# Patient Record
Sex: Female | Born: 1970
Health system: Southern US, Community
[De-identification: ages and names within clinical notes are randomized; demographics above are authoritative.]

## PROBLEM LIST (undated history)

## (undated) DIAGNOSIS — K219 Gastro-esophageal reflux disease without esophagitis: Secondary | ICD-10-CM

## (undated) HISTORY — PX: BLADDER SURGERY: SHX569

## (undated) HISTORY — DX: Gastro-esophageal reflux disease without esophagitis: K21.9

---

## 2001-04-07 ENCOUNTER — Encounter: Admission: RE | Admit: 2001-04-07 | Discharge: 2001-04-07 | Payer: Self-pay | Admitting: General Practice

## 2001-04-07 ENCOUNTER — Encounter: Payer: Self-pay | Admitting: General Practice

## 2001-04-16 ENCOUNTER — Encounter: Admission: RE | Admit: 2001-04-16 | Discharge: 2001-04-16 | Payer: Self-pay | Admitting: Urology

## 2001-04-16 ENCOUNTER — Encounter: Payer: Self-pay | Admitting: Urology

## 2002-02-28 ENCOUNTER — Encounter: Admission: RE | Admit: 2002-02-28 | Discharge: 2002-02-28 | Payer: Self-pay | Admitting: Internal Medicine

## 2002-03-08 ENCOUNTER — Other Ambulatory Visit: Admission: RE | Admit: 2002-03-08 | Discharge: 2002-03-08 | Payer: Self-pay | Admitting: Obstetrics and Gynecology

## 2003-12-05 ENCOUNTER — Inpatient Hospital Stay (HOSPITAL_COMMUNITY): Admission: AD | Admit: 2003-12-05 | Discharge: 2003-12-05 | Payer: Self-pay | Admitting: *Deleted

## 2004-03-11 ENCOUNTER — Emergency Department (HOSPITAL_COMMUNITY): Admission: EM | Admit: 2004-03-11 | Discharge: 2004-03-11 | Payer: Self-pay | Admitting: Family Medicine

## 2004-03-21 ENCOUNTER — Ambulatory Visit: Payer: Self-pay | Admitting: Sports Medicine

## 2004-04-04 ENCOUNTER — Ambulatory Visit (HOSPITAL_COMMUNITY): Admission: RE | Admit: 2004-04-04 | Discharge: 2004-04-04 | Payer: Self-pay | Admitting: *Deleted

## 2004-04-16 ENCOUNTER — Ambulatory Visit: Payer: Self-pay | Admitting: Family Medicine

## 2004-04-23 ENCOUNTER — Ambulatory Visit: Payer: Self-pay | Admitting: Family Medicine

## 2004-05-14 ENCOUNTER — Ambulatory Visit: Payer: Self-pay | Admitting: Family Medicine

## 2004-05-27 ENCOUNTER — Ambulatory Visit (HOSPITAL_COMMUNITY): Admission: RE | Admit: 2004-05-27 | Discharge: 2004-05-27 | Payer: Self-pay | Admitting: Family Medicine

## 2004-06-03 ENCOUNTER — Ambulatory Visit: Payer: Self-pay | Admitting: Family Medicine

## 2004-06-21 ENCOUNTER — Ambulatory Visit: Payer: Self-pay | Admitting: Family Medicine

## 2004-07-04 ENCOUNTER — Ambulatory Visit: Payer: Self-pay | Admitting: Family Medicine

## 2004-07-09 ENCOUNTER — Ambulatory Visit: Payer: Self-pay | Admitting: Family Medicine

## 2004-07-10 ENCOUNTER — Ambulatory Visit (HOSPITAL_COMMUNITY): Admission: RE | Admit: 2004-07-10 | Discharge: 2004-07-10 | Payer: Self-pay | Admitting: Family Medicine

## 2004-07-18 ENCOUNTER — Ambulatory Visit: Payer: Self-pay | Admitting: Family Medicine

## 2004-07-24 ENCOUNTER — Ambulatory Visit: Payer: Self-pay | Admitting: Family Medicine

## 2004-07-26 ENCOUNTER — Ambulatory Visit (HOSPITAL_COMMUNITY): Admission: RE | Admit: 2004-07-26 | Discharge: 2004-07-26 | Payer: Self-pay | Admitting: Family Medicine

## 2004-07-28 ENCOUNTER — Inpatient Hospital Stay (HOSPITAL_COMMUNITY): Admission: AD | Admit: 2004-07-28 | Discharge: 2004-07-30 | Payer: Self-pay | Admitting: Obstetrics & Gynecology

## 2004-07-28 ENCOUNTER — Encounter (INDEPENDENT_AMBULATORY_CARE_PROVIDER_SITE_OTHER): Payer: Self-pay | Admitting: *Deleted

## 2004-07-28 ENCOUNTER — Ambulatory Visit: Payer: Self-pay | Admitting: Sports Medicine

## 2004-07-28 ENCOUNTER — Ambulatory Visit: Payer: Self-pay | Admitting: Obstetrics & Gynecology

## 2004-09-11 ENCOUNTER — Ambulatory Visit: Payer: Self-pay | Admitting: Family Medicine

## 2004-10-16 ENCOUNTER — Ambulatory Visit: Payer: Self-pay | Admitting: Family Medicine

## 2005-01-15 ENCOUNTER — Ambulatory Visit: Payer: Self-pay | Admitting: Family Medicine

## 2005-02-14 ENCOUNTER — Ambulatory Visit: Payer: Self-pay | Admitting: Family Medicine

## 2005-04-14 ENCOUNTER — Ambulatory Visit: Payer: Self-pay | Admitting: Family Medicine

## 2005-04-28 ENCOUNTER — Encounter (INDEPENDENT_AMBULATORY_CARE_PROVIDER_SITE_OTHER): Payer: Self-pay | Admitting: *Deleted

## 2005-04-28 LAB — CONVERTED CEMR LAB

## 2005-05-19 ENCOUNTER — Ambulatory Visit: Payer: Self-pay | Admitting: Family Medicine

## 2005-05-26 ENCOUNTER — Ambulatory Visit: Payer: Self-pay | Admitting: Family Medicine

## 2005-06-09 IMAGING — US US OB DETAIL+14 WK
1 series · 18 of 28 positions shown · non-contrast
Comparison: none

CLINICAL DATA: G6 P3 TAB2 (twins).  One full term demise.  LMP 10/19/03.  Assess anatomy.

[Series 1: us ob detail +14 wk · 18 of 75 slices shown]
[im 1/75]
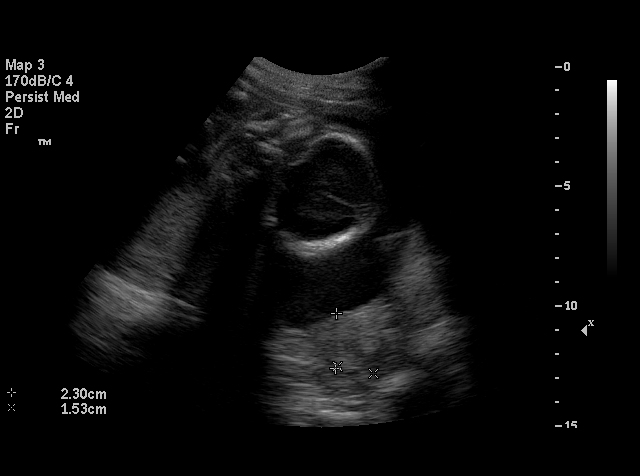
[im 6/75]
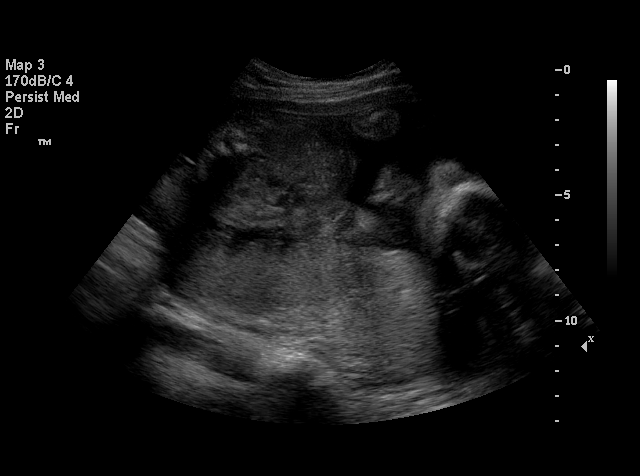
[im 9/75]
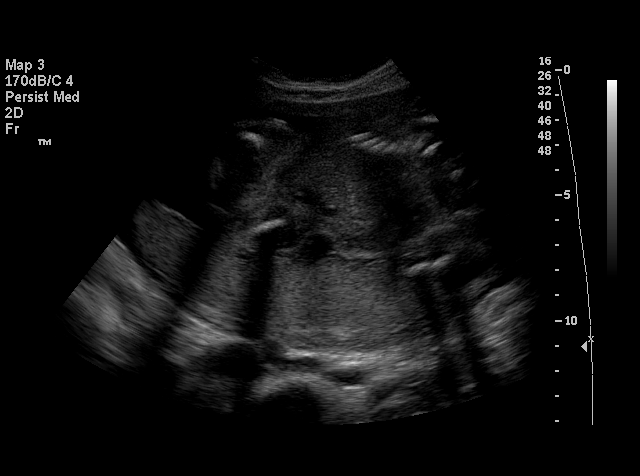
[im 14/75]
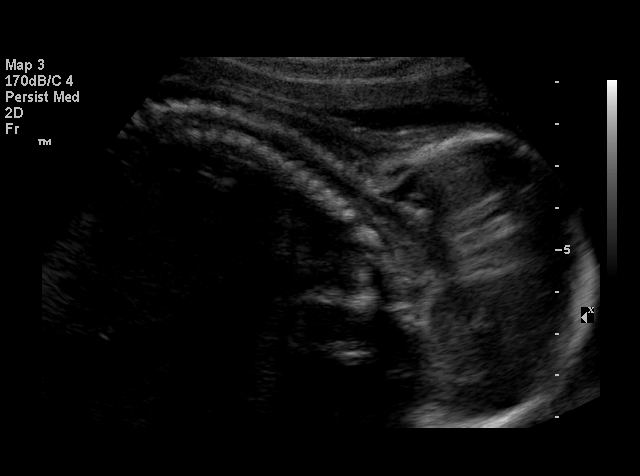
[im 20/75]
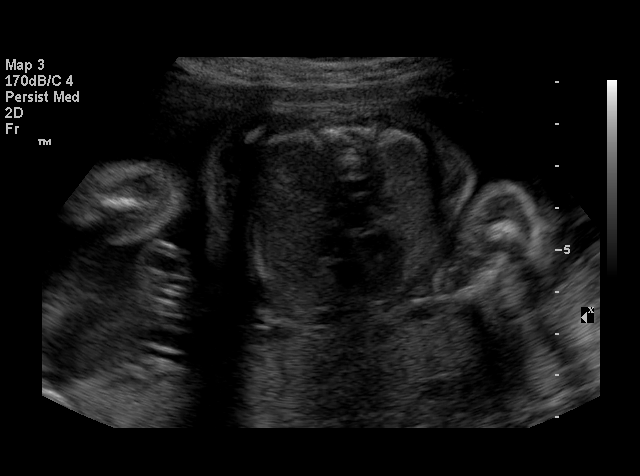
[im 22/75]
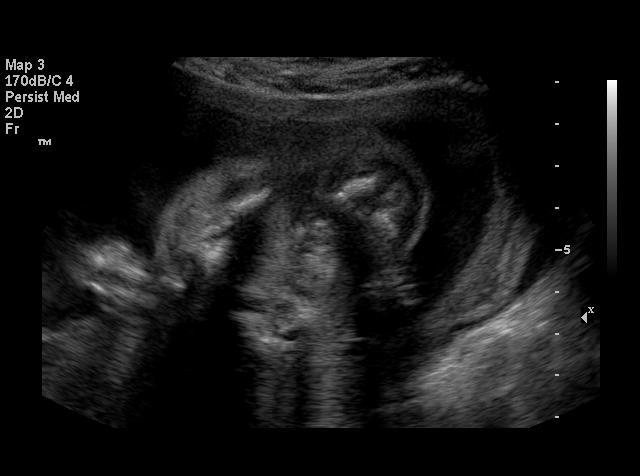
[im 28/75]
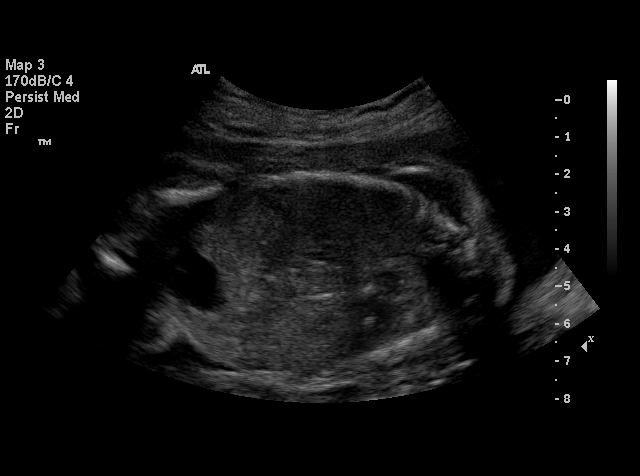
[im 31/75]
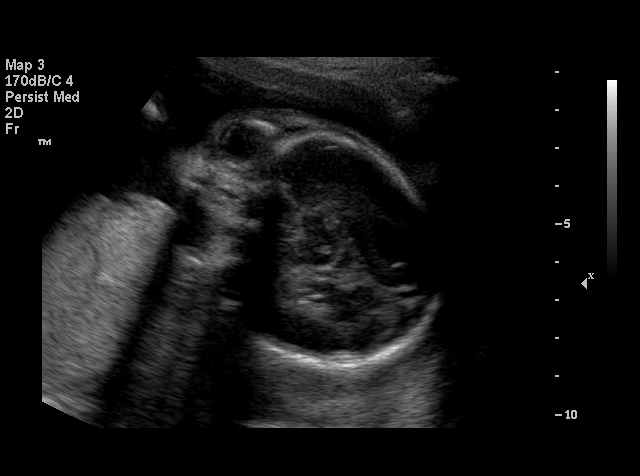
[im 36/75]
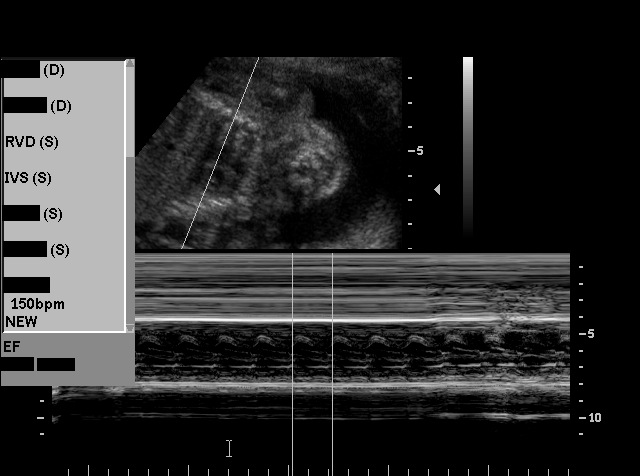
[im 39/75]
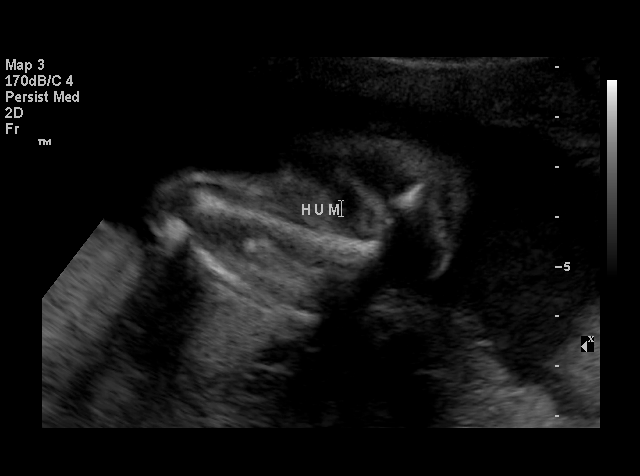
[im 44/75]
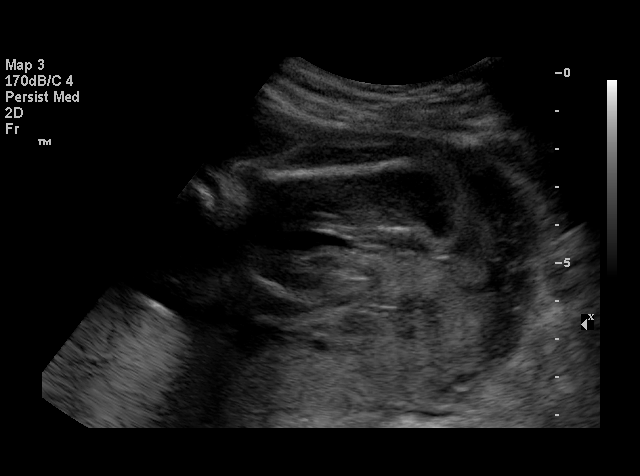
[im 47/75]
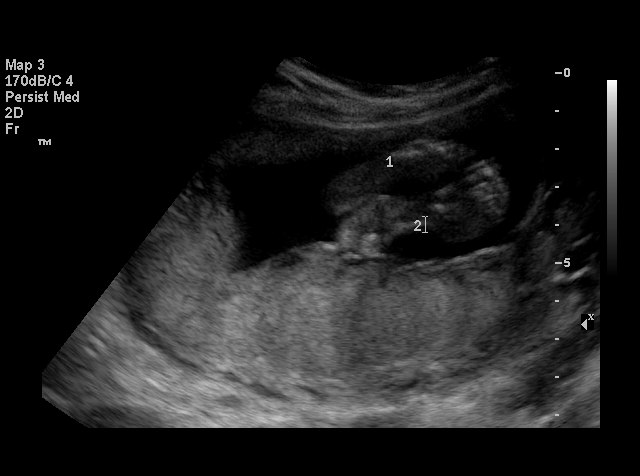
[im 53/75]
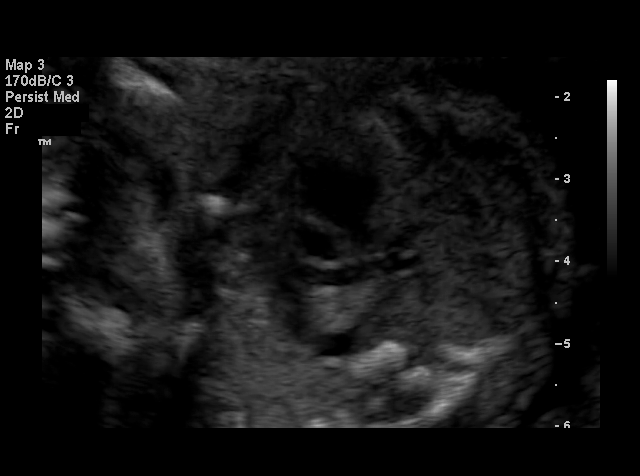
[im 58/75]
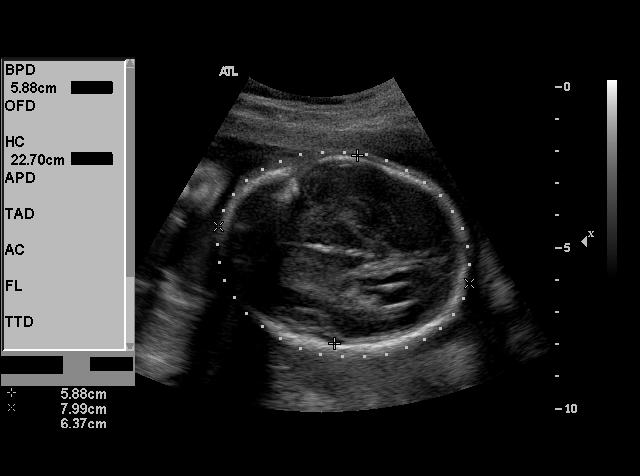
[im 61/75]
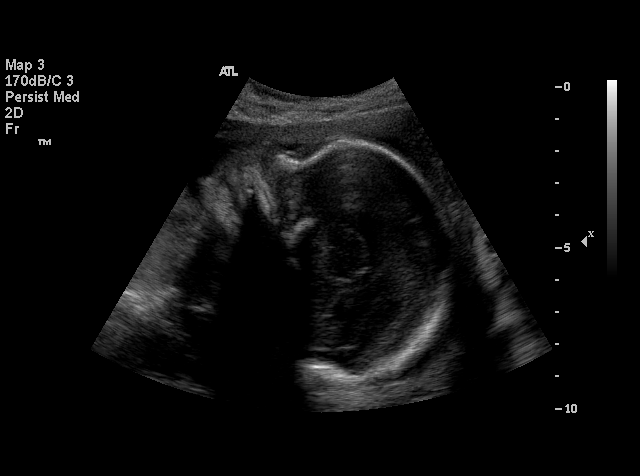
[im 66/75]
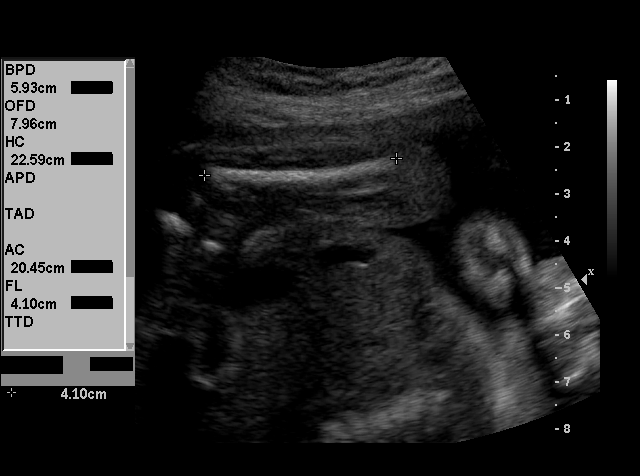
[im 69/75]
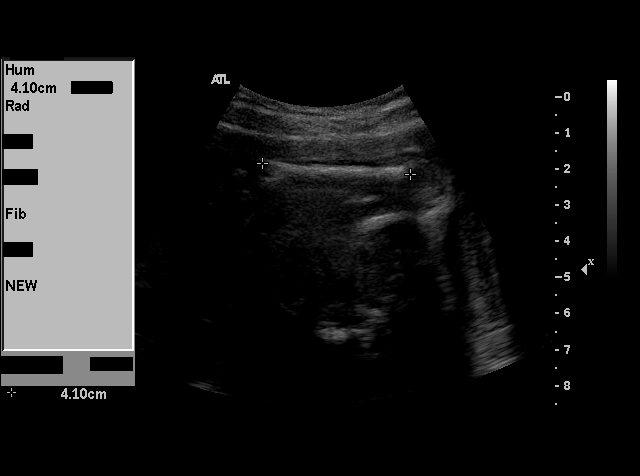
[im 75/75]
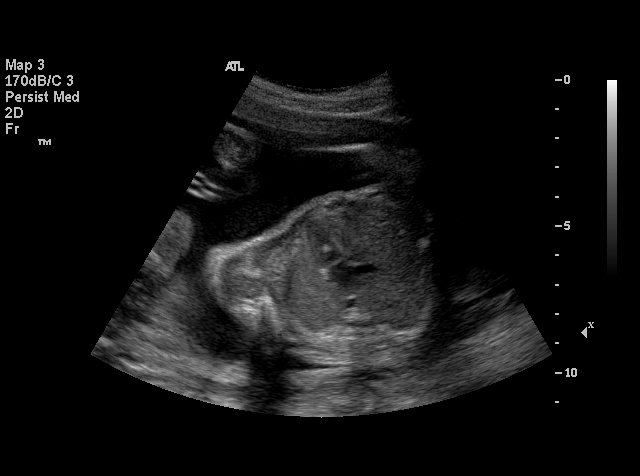

[18 of 28 positions shown; findings below may reference images not displayed]

DETAILED OBSTETRICAL ULTRASOUND 

 Number of Fetuses:  1
 Heart Rate:  150
 Movement:  Yes
 Breathing:  Yes
 Presentation:  Cephalic
 Placental Location:  Posterior
 Grade:  I
 Previa:  No
 Amniotic Fluid (Subjective):  Normal
 Amniotic Fluid (Objective):  3.9 cm Vertical pocket 

 FETAL BIOMETRY
 BPD:  5.9 cm   24 w 0 d
 HC:  22.4 cm  24 w 4 d
 AC:  20.5 cm   25 w 1 d
 FL:   4.2 cm   23 w 4 d

 MEAN GA:  24 w 3 d
 GA BY LMP:  24 w 0 d (Assigned)

 FETAL ANATOMY
 Lateral Ventricles:  Visualized 
 Thalami/CSP:  Visualized 
 Posterior Fossa:  Visualized 
 Nuchal Region:  N/A
 Spine:  Visualized 
 4 Chamber Heart on Left:  Visualized 
 Stomach on Left:  Visualized 
 3 Vessel Cord:  Visualized 
 Cord Insertion Site:  Visualized 
 Kidneys:  Visualized 
 Bladder:  Visualized 
 Extremities:  Visualized 

 ADDITIONAL ANATOMY VISUALIZED:  LVOT, RVOT, upper lip, orbits, profile, diaphragm, heel, 5th digit, ductal arch, aortic arch, and female genitalia.  
 Comment:  There is an EIF in the left ventricle.  This finding is quite common in the Asian population and is not felt to be a marker for Down syndrome in this population.

 MATERNAL FINDINGS
 Cervix:  3.8 cm Transabdominally
IMPRESSION: Single living intrauterine fetus in cephalic presentation.  Patient is 24 weeks by LMP dating and measures 24 weeks 3 days today.  Growth is appropriate 
 No focal fetal or placental abnormality identified.  

 </u12:p>

## 2005-07-14 ENCOUNTER — Ambulatory Visit: Payer: Self-pay | Admitting: Sports Medicine

## 2005-07-23 ENCOUNTER — Emergency Department (HOSPITAL_COMMUNITY): Admission: EM | Admit: 2005-07-23 | Discharge: 2005-07-23 | Payer: Self-pay | Admitting: Emergency Medicine

## 2005-08-01 IMAGING — US US OB FOLLOW-UP
1 series · 13 of 28 positions shown · non-contrast
Comparison: none

CLINICAL DATA: Assess amniotic fluid volume and fetal growth.

[Series 1: us ob follow-up · 0.32mm/px · 13 of 39 slices shown]
[im 2/39]
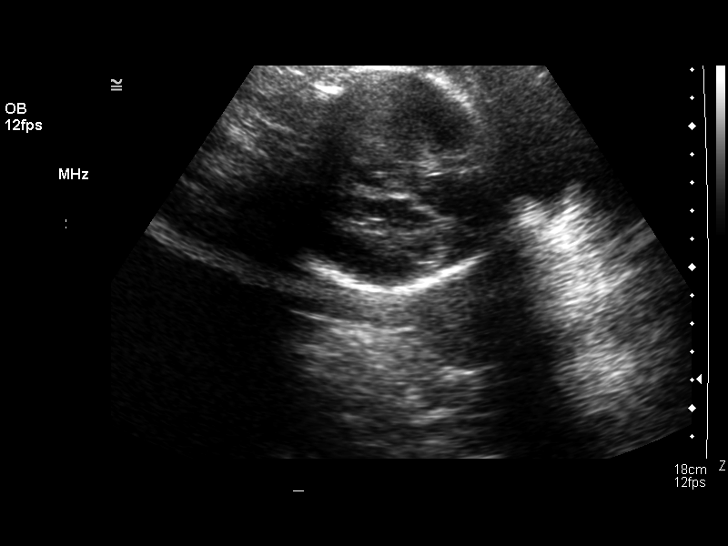
[im 5/39]
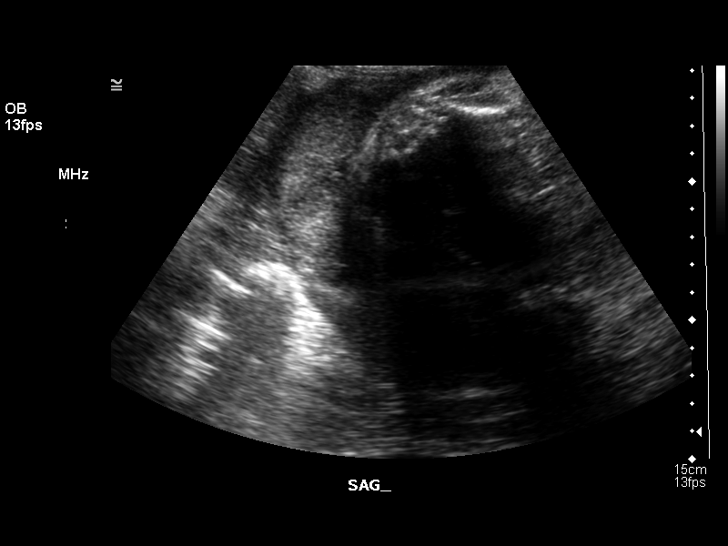
[im 8/39]
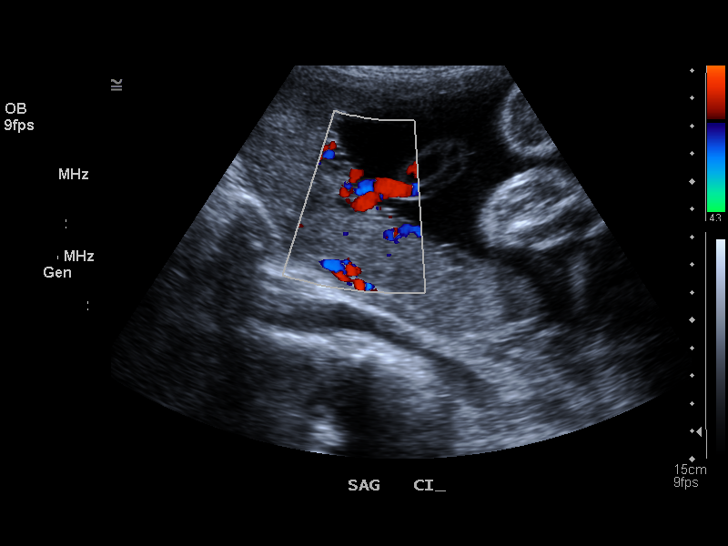
[im 10/39]
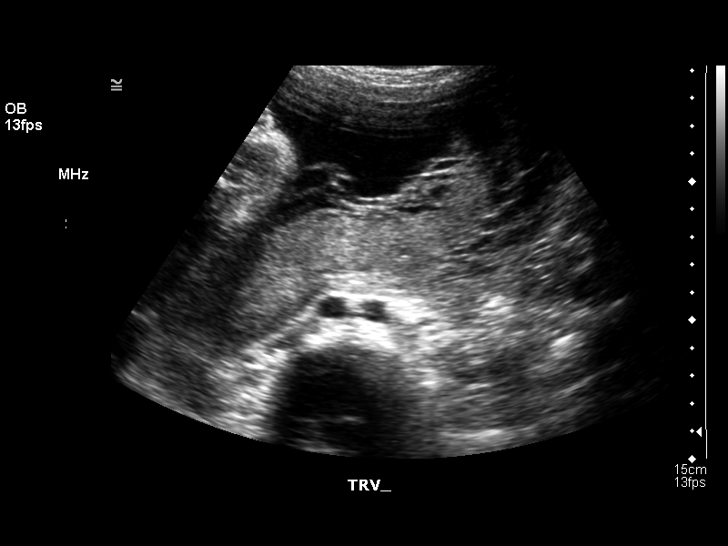
[im 13/39]
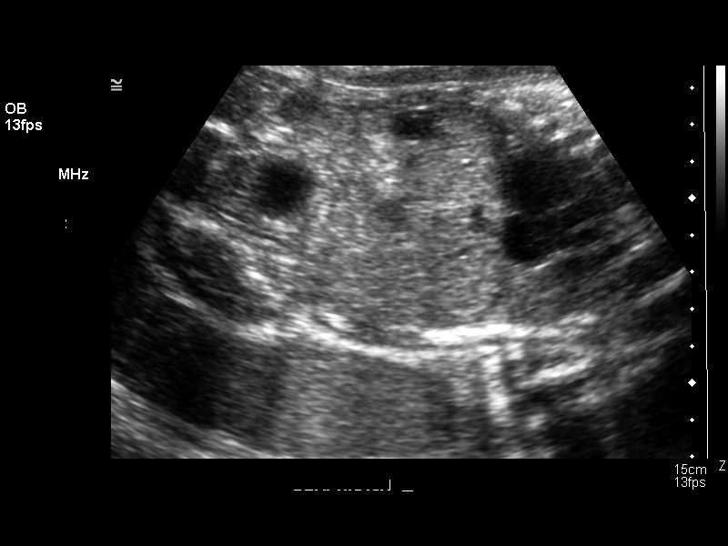
[im 16/39]
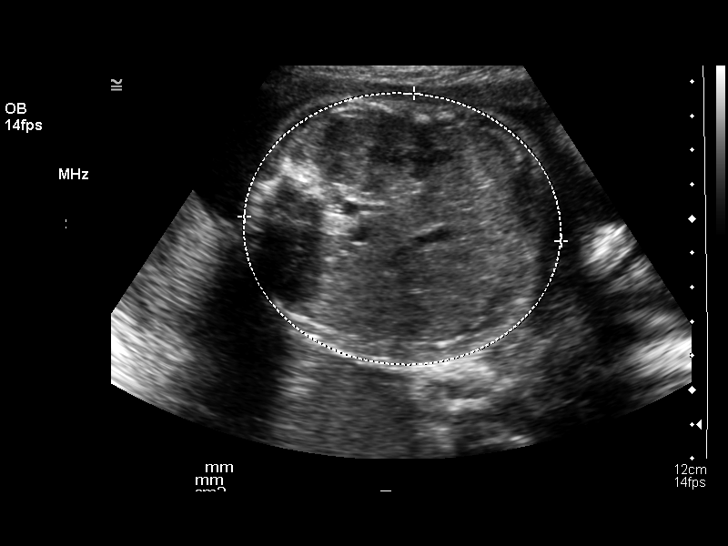
[im 20/39]
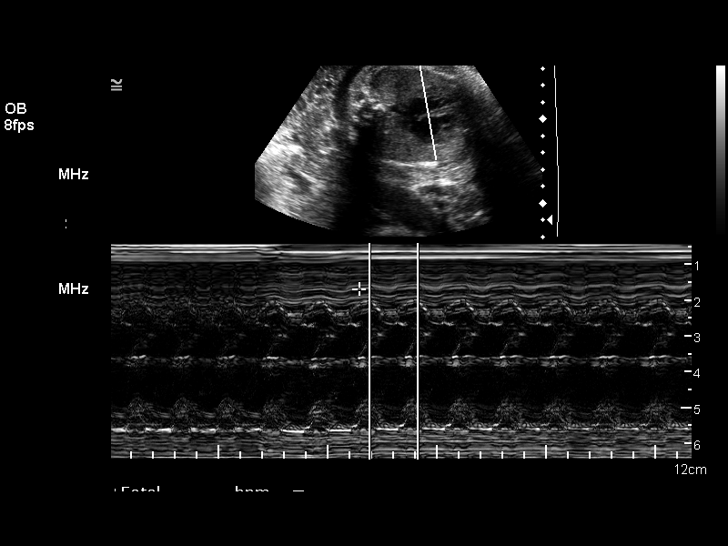
[im 23/39]
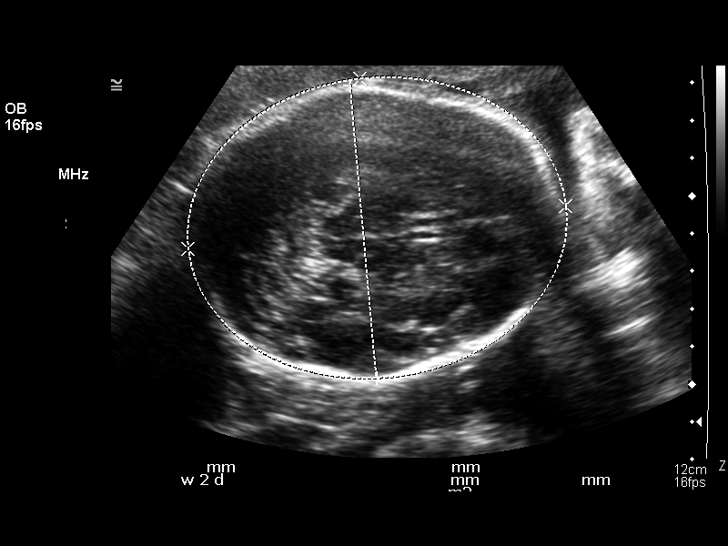
[im 26/39]
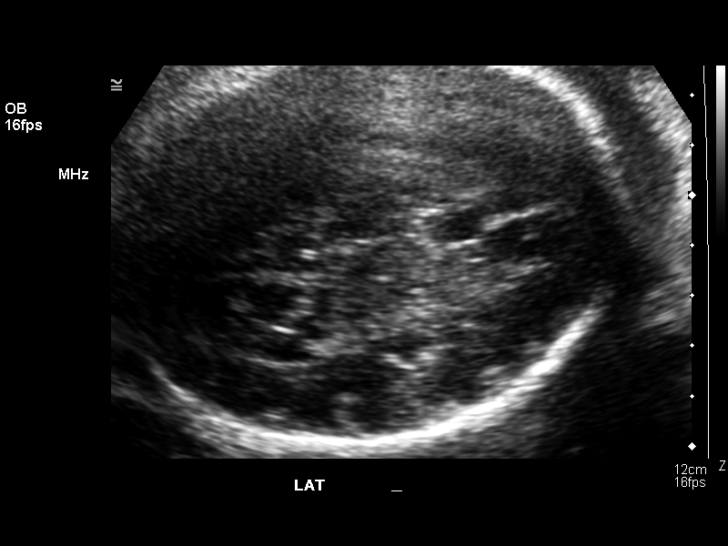
[im 29/39]
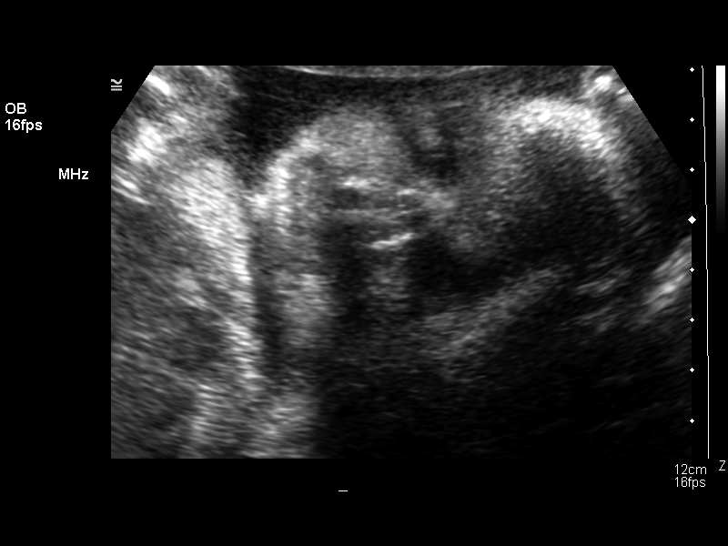
[im 31/39]
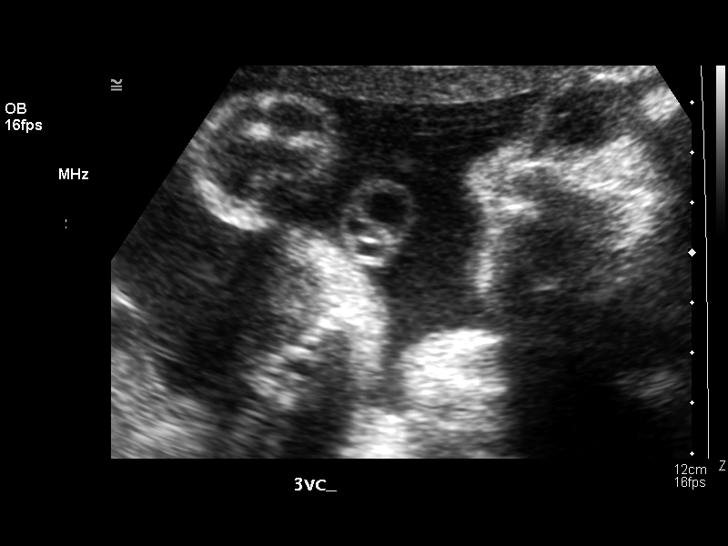
[im 34/39]
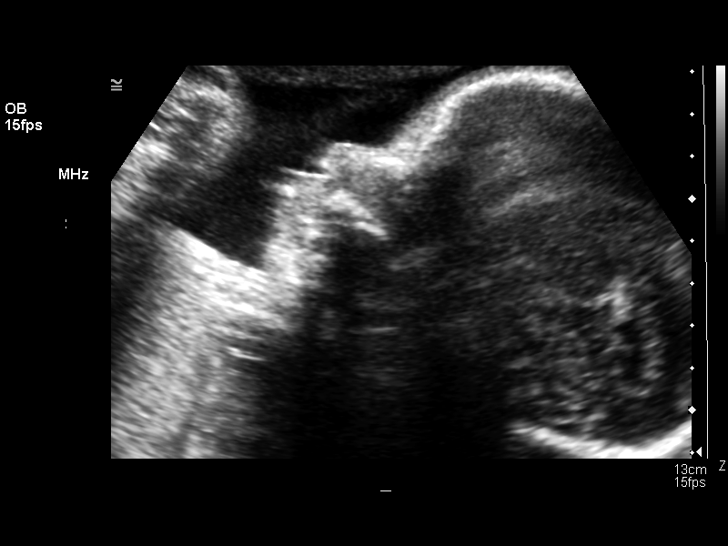
[im 37/39]
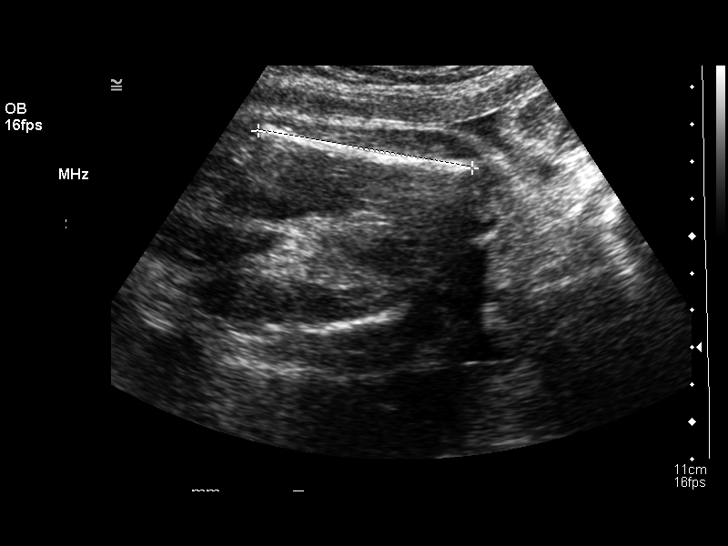

[13 of 28 positions shown; findings below may reference images not displayed]

OBSTETRICAL ULTRASOUND RE-EVALUATION:
Number of Fetuses:  1
Heart Rate:  136
Movement:  Yes
Breathing:  No
Presentation:  Cephalic
Placental Location:  Posterior
Grade:  I
Previa:  No
Amniotic Fluid (subjective):  Normal
Amniotic Fluid (objective): 13.9 cm AFI (5th -95th%ile = 8.6 ? 24.2 cm for 32 wks)

FETAL BIOMETRY
BPD:  7.8 cm   31 w 2 d
HC:  28.5 cm  31 w 2 d
AC:  27.3 cm   31 w 3 d
FL:  5.9 cm  30 w 5 d

Mean GA:  31 w 1 d
Assigned GA:  31 w 4 d
Fetal indices are within normal limits.

EFW:  2628 g (H) 50th ? 75th%ile (0638 ? 1788 g) For 32 wks

FETAL ANATOMY
Lateral Ventricles:  Visualized 
Thalami/CSP:  Visualized 
Posterior Fossa:  Visualized 
Nuchal Region:  N/A
Spine:  Previously seen 
4 Chamber Heart on Left:  Previously seen 
Stomach on Left:  Visualized 
3 Vessel Cord:  Visualized 
Cord Insertion Site:  Previously seen 
Kidneys:  Visualized 
Bladder:  Visualized 
Extremities:  Previously seen 

MATERNAL UTERINE AND ADNEXAL FINDINGS
Cervix:  3.1 cm Transabdominally
IMPRESSION: 1.  Single intrauterine pregnancy demonstrating an estimated gestational age by ultrasound of 31 weeks and 1 day.  Correlation with assigned gestational age by LMP and initial ultrasound of 31 weeks and 4 days suggests appropriate growth.  Currently the estimated fetal weight is just above the 50th percentile for a 32 week gestation. 
2.  Subjectively and quantitatively normal amniotic fluid volume.
3.  No late developing fetal anatomic abnormalities are identified associated with the lateral ventricles, stomach, kidneys or bladder.  A four chamber heart view could not be reassessed due to positioning on today?s exam.

## 2005-10-13 ENCOUNTER — Ambulatory Visit: Payer: Self-pay | Admitting: Family Medicine

## 2005-12-17 ENCOUNTER — Ambulatory Visit: Payer: Self-pay | Admitting: Family Medicine

## 2006-02-05 ENCOUNTER — Ambulatory Visit: Payer: Self-pay | Admitting: Family Medicine

## 2006-08-19 ENCOUNTER — Encounter: Payer: Self-pay | Admitting: Family Medicine

## 2006-08-19 ENCOUNTER — Ambulatory Visit: Payer: Self-pay | Admitting: Family Medicine

## 2006-08-20 DIAGNOSIS — G5600 Carpal tunnel syndrome, unspecified upper limb: Secondary | ICD-10-CM | POA: Insufficient documentation

## 2006-08-21 ENCOUNTER — Encounter (INDEPENDENT_AMBULATORY_CARE_PROVIDER_SITE_OTHER): Payer: Self-pay | Admitting: *Deleted

## 2006-12-14 ENCOUNTER — Telehealth (INDEPENDENT_AMBULATORY_CARE_PROVIDER_SITE_OTHER): Payer: Self-pay | Admitting: *Deleted

## 2006-12-14 ENCOUNTER — Ambulatory Visit: Payer: Self-pay | Admitting: Sports Medicine

## 2007-03-29 ENCOUNTER — Ambulatory Visit: Payer: Self-pay | Admitting: Sports Medicine

## 2007-10-23 ENCOUNTER — Emergency Department (HOSPITAL_COMMUNITY): Admission: EM | Admit: 2007-10-23 | Discharge: 2007-10-23 | Payer: Self-pay | Admitting: Emergency Medicine

## 2007-10-28 ENCOUNTER — Telehealth: Payer: Self-pay | Admitting: *Deleted

## 2007-10-29 ENCOUNTER — Ambulatory Visit: Payer: Self-pay | Admitting: Family Medicine

## 2007-10-29 DIAGNOSIS — J309 Allergic rhinitis, unspecified: Secondary | ICD-10-CM | POA: Insufficient documentation

## 2007-11-26 ENCOUNTER — Encounter (INDEPENDENT_AMBULATORY_CARE_PROVIDER_SITE_OTHER): Payer: Self-pay | Admitting: Family Medicine

## 2007-11-26 ENCOUNTER — Ambulatory Visit: Payer: Self-pay | Admitting: Family Medicine

## 2007-11-26 ENCOUNTER — Telehealth: Payer: Self-pay | Admitting: *Deleted

## 2007-11-26 DIAGNOSIS — R1084 Generalized abdominal pain: Secondary | ICD-10-CM | POA: Insufficient documentation

## 2007-11-26 LAB — CONVERTED CEMR LAB
Albumin: 4.5 g/dL (ref 3.5–5.2)
Alkaline Phosphatase: 77 units/L (ref 39–117)
BUN: 13 mg/dL (ref 6–23)
Beta hcg, urine, semiquantitative: NEGATIVE
CO2: 22 meq/L (ref 19–32)
Calcium: 9.3 mg/dL (ref 8.4–10.5)
Chlamydia, DNA Probe: NEGATIVE
Creatinine, Ser: 0.87 mg/dL (ref 0.40–1.20)
Eosinophils Relative: 3 % (ref 0–5)
Glucose, Bld: 72 mg/dL (ref 70–99)
Lymphs Abs: 1.9 10*3/uL (ref 0.7–4.0)
Monocytes Absolute: 0.3 10*3/uL (ref 0.1–1.0)
Monocytes Relative: 8 % (ref 3–12)
Neutro Abs: 2 10*3/uL (ref 1.7–7.7)
Platelets: 152 10*3/uL (ref 150–400)
RBC: 5.53 M/uL — ABNORMAL HIGH (ref 3.87–5.11)
Total Bilirubin: 0.6 mg/dL (ref 0.3–1.2)
Total Protein: 8 g/dL (ref 6.0–8.3)
WBC: 4.4 10*3/uL (ref 4.0–10.5)

## 2007-11-29 ENCOUNTER — Telehealth: Payer: Self-pay | Admitting: *Deleted

## 2007-11-29 ENCOUNTER — Ambulatory Visit: Payer: Self-pay | Admitting: Family Medicine

## 2007-11-30 ENCOUNTER — Encounter (INDEPENDENT_AMBULATORY_CARE_PROVIDER_SITE_OTHER): Payer: Self-pay | Admitting: Family Medicine

## 2008-03-14 ENCOUNTER — Emergency Department (HOSPITAL_COMMUNITY): Admission: EM | Admit: 2008-03-14 | Discharge: 2008-03-14 | Payer: Self-pay | Admitting: Family Medicine

## 2008-03-16 ENCOUNTER — Encounter: Payer: Self-pay | Admitting: *Deleted

## 2008-04-17 ENCOUNTER — Encounter: Payer: Self-pay | Admitting: *Deleted

## 2008-07-31 ENCOUNTER — Ambulatory Visit: Payer: Self-pay | Admitting: Family Medicine

## 2008-09-06 ENCOUNTER — Telehealth: Payer: Self-pay | Admitting: Family Medicine

## 2008-09-07 ENCOUNTER — Encounter (INDEPENDENT_AMBULATORY_CARE_PROVIDER_SITE_OTHER): Payer: Self-pay | Admitting: Family Medicine

## 2008-09-07 ENCOUNTER — Ambulatory Visit: Payer: Self-pay | Admitting: Family Medicine

## 2008-09-07 DIAGNOSIS — R42 Dizziness and giddiness: Secondary | ICD-10-CM

## 2008-09-07 DIAGNOSIS — K219 Gastro-esophageal reflux disease without esophagitis: Secondary | ICD-10-CM

## 2008-09-07 DIAGNOSIS — R1013 Epigastric pain: Secondary | ICD-10-CM | POA: Insufficient documentation

## 2008-09-08 LAB — CONVERTED CEMR LAB
ALT: 17 units/L (ref 0–35)
Albumin: 4.1 g/dL (ref 3.5–5.2)
Alkaline Phosphatase: 84 units/L (ref 39–117)
Basophils Relative: 1 % (ref 0–1)
Calcium: 8.7 mg/dL (ref 8.4–10.5)
Chloride: 108 meq/L (ref 96–112)
Eosinophils Absolute: 0.1 10*3/uL (ref 0.0–0.7)
Glucose, Bld: 77 mg/dL (ref 70–99)
HDL: 33 mg/dL — ABNORMAL LOW (ref >39)
Hemoglobin: 8.5 g/dL — ABNORMAL LOW (ref 12.0–15.0)
LDL Cholesterol: 36 mg/dL (ref 0–99)
Lymphocytes Relative: 38 % (ref 12–46)
Lymphs Abs: 1.5 10*3/uL (ref 0.7–4.0)
Monocytes Relative: 8 % (ref 3–12)
Potassium: 4.1 meq/L (ref 3.5–5.3)
RBC: 4.87 M/uL (ref 3.87–5.11)
RDW: 19.3 % — ABNORMAL HIGH (ref 11.5–15.5)
Sodium: 139 meq/L (ref 135–145)
Total Protein: 7.1 g/dL (ref 6.0–8.3)
VLDL: 43 mg/dL — ABNORMAL HIGH (ref 0–40)

## 2010-01-11 ENCOUNTER — Ambulatory Visit: Payer: Self-pay | Admitting: Family Medicine

## 2010-01-15 ENCOUNTER — Encounter: Payer: Self-pay | Admitting: Family Medicine

## 2010-06-13 ENCOUNTER — Encounter: Payer: Self-pay | Admitting: Family Medicine

## 2010-07-23 NOTE — Letter (Signed)
Summary: Pap result  [facility]  [address]   [place], [place] [address]   Phone: [phone]  Fax: [phone]    01/15/2010  Audrey Hinton [address] [place], [place]  [address]  Dear [name],  Your pap smear result was Negative (or normal).     Sincerely,   Jaunice Mirza MD  Appended Document: Pap result mailed

## 2010-07-23 NOTE — Assessment & Plan Note (Signed)
Summary: check IUD/eo   Vital Signs:  Patient profile:   40 year old female Menstrual status:  regular Weight:      129 pounds Pulse rate:   74 / minute BP sitting:   100 / 62  (left arm) Cuff size:   regular  Vitals Entered By: Renato Battles slade,cma CC: check IUD Is Patient Diabetic? No Pain Assessment Patient in pain? no        Primary Care Provider:  Jari Carollo MD  CC:  check IUD.  History of Present Illness: 40 y/o F here for IUD check.  Pt comes yearly for IUD check.  States that it was inserted in 2007 and is supposed to last 10 yrs.  It was inserted at [facility].  I was not able to find pt's paper chart.  Current Centricity does not indicate when it was inserted.    Menses:  monthly, lasts 7 days.  LMP beginning of this month.  No vag discharge.  No painful intercourse.  No abnormal bleeding.  No history of abnormal pap smear.     Habits & Providers  Alcohol-Tobacco-Diet     Tobacco Status: never  Current Medications (verified): 1)  Nexium 40 Mg Cpdr (Esomeprazole Magnesium) .Marland Kitchen.. 1 Tab By Mouth Daily 2)  Caltrate 600+d 600-400 Mg-Unit Tabs (Calcium Carbonate-Vitamin D) .... 2 Tabs By Mouth Daily  Allergies (verified): No Known Drug Allergies  Past History:  Family History: Last updated: 01/11/2010 None  Social History: Last updated: 01/11/2010 Lives in [place] with Husband Noemi Chapel Y) and 4 children (Bet Piper 19, Doet Janowski 16, Suu Heinze 13, Ammy Felten 5) Emigrated to [name] 2002. [place], speaks [place]. Works in housekeeping at [employer] No smoking/alcohol use/no drugs  Risk Factors: Alcohol Use: 0 (09/07/2008) Exercise: yes (09/07/2008)  Risk Factors: Smoking Status: never (01/11/2010)  Past Medical History: G5P4 0014 -  G1-G4FTSVD, no  Cx GERD  Past Surgical History: Gallbladder surgery? In [place]  Family History: None  Social History: Lives in [place] with Husband Noemi Chapel Y) and 4 children (Bet Robitaille 19, Doet Jablonski 16, Suu Lanzo 13,  Ammy Mcclusky 5) Emigrated to [name] 2002. [place], speaks [place]. Works in housekeeping at [employer] No smoking/alcohol use/no drugs  Review of Systems General:  Denies chills, fever, loss of appetite, and weakness. GU:  Denies abnormal vaginal bleeding, discharge, dysuria, genital sores, urinary frequency, and urinary hesitancy.  Physical Exam  General:  Well-developed,well-nourished,in no acute distress; alert,appropriate and cooperative throughout examination. vitals reviewed.  Breasts:  No mass, nodules, thickening, tenderness, bulging, retraction, inflamation, nipple discharge or skin changes noted.   Genitalia:  Normal introitus for age, no external lesions, no vaginal discharge, mucosa pink and moist, no vaginal or cervical lesions, no vaginal atrophy, no friaility or hemorrhage, normal uterus size and position, no adnexal masses or tenderness  IUD string visible from cervix, clear in color.   Axillary Nodes:  No palpable lymphadenopathy Inguinal Nodes:  No significant adenopathy   Impression & Recommendations:  Problem # 1:  Gynecological examination-routine (ICD-V72.31) Assessment Unchanged Exam normal.  Pap done.   Problem # 2:  CONTRACEPTIVE SURVEILLANCE, IUD (ICD-V25.42) Assessment: Unchanged IUD string visible.  Will need to find out when it was inserted.  Will need to get old chart.   Orders: [facility]- Est Level  3 (04540)  Problem # 3:  Screening Breast Cancer (ICD-V76.10) Assessment: Unchanged Order given.  Breast exam done.  No nodes or massed felt on exam. No nipple discharge.   Complete Medication List: 1)  Nexium 40 Mg Cpdr (Esomeprazole magnesium) .Marland Kitchen.. 1 tab by mouth daily 2)  Caltrate 600+d 600-400 Mg-unit Tabs (Calcium carbonate-vitamin d) .... 2 tabs by mouth daily  Other Orders: Pap Smear-[facility] ([id number]) Mammogram (Screening) (Mammo)  Patient Instructions: 1)  Please schedule a follow-up appointment in 2-4weeks with Dr Janalyn Harder.  2)  Schedule your  mammogram.  3)  New medicine: Nexium 40 mg once a day 4)  New medicine: Calcium 2 every day Prescriptions: CALTRATE 600+D 600-400 MG-UNIT TABS (CALCIUM CARBONATE-VITAMIN D) 2 tabs by mouth daily  #60 x 12   Entered and Authorized by:   Angeline Slim MD   Signed by:   Angeline Slim MD on 01/11/2010   Method used:   Electronically to        [facility]* (retail)       [address]       [address] Shipping/mailing       [place], [place]  [address]       Ph: [phone]       Fax: [phone]   RxID:   [id number] NEXIUM 40 MG CPDR (ESOMEPRAZOLE MAGNESIUM) 1 tab by mouth daily  #30 x 12   Entered and Authorized by:   Angeline Slim MD   Signed by:   Angeline Slim MD on 01/11/2010   Method used:   Electronically to        [facility]* (retail)       [address]       [address] Shipping/mailing       [place], [place]  [address]       Ph: [phone]       Fax: [phone]   RxID:   [id number]    Prevention & Chronic Care Immunizations   Influenza vaccine: Not documented    Tetanus booster: Not documented    Pneumococcal vaccine: Not documented  Other Screening   Pap smear: Normal  (12/02/2007)   Pap smear action/deferral: Ordered  (01/11/2010)   Pap smear due: 11/2009    Mammogram: Not documented   Mammogram action/deferral: Ordered  (01/11/2010)   Smoking status: never  (01/11/2010)  Lipids   Total Cholesterol: 112  (09/07/2008)   LDL: 36  (09/07/2008)   LDL Direct: Not documented   HDL: 33  (09/07/2008)   Triglycerides: 217  (09/07/2008)   Nursing Instructions: Pap smear today Schedule screening mammogram (see order)

## 2010-07-25 NOTE — Miscellaneous (Signed)
  Clinical Lists Changes  Problems: Removed problem of SCREENING FOR LIPOID DISORDERS (ICD-V77.91) Removed problem of GYNECOLOGICAL EXAMINATIONOUTINE (ICD-V72.31) Removed problem of SCREENING FOR MALIGNANT NEOPLASM(ICD-V76.2) Removed problem of CONTRACEPTIVE SURVEILLANCE, IUD (ICD-V25.42) Removed problem of BLEPHARITIS, RIGHT (ICD-373.00)

## 2010-08-14 ENCOUNTER — Encounter: Payer: Self-pay | Admitting: *Deleted

## 2010-09-02 ENCOUNTER — Ambulatory Visit (INDEPENDENT_AMBULATORY_CARE_PROVIDER_SITE_OTHER): Payer: Self-pay | Admitting: Family Medicine

## 2010-09-02 ENCOUNTER — Encounter: Payer: Self-pay | Admitting: Family Medicine

## 2010-09-02 VITALS — BP 107/73 | HR 66 | Ht 62.5 in | Wt 130.5 lb

## 2010-09-02 DIAGNOSIS — R5383 Other fatigue: Secondary | ICD-10-CM | POA: Insufficient documentation

## 2010-09-02 DIAGNOSIS — R5381 Other malaise: Secondary | ICD-10-CM

## 2010-09-02 LAB — CONVERTED CEMR LAB
BUN: 18 mg/dL (ref 6–23)
CO2: 23 meq/L (ref 19–32)
Calcium: 8.9 mg/dL (ref 8.4–10.5)
Creatinine, Ser: 0.89 mg/dL (ref 0.40–1.20)
Glucose, Bld: 78 mg/dL (ref 70–99)
Hemoglobin: 10.4 g/dL — ABNORMAL LOW (ref 12.0–15.0)
Potassium: 4.1 meq/L (ref 3.5–5.3)
T4, Total: 8.5 ug/dL (ref 5.0–12.5)
TSH: 5.256 microintl units/mL — ABNORMAL HIGH (ref 0.350–4.500)

## 2010-09-02 LAB — BASIC METABOLIC PANEL
CO2: 23 mEq/L (ref 19–32)
Chloride: 105 mEq/L (ref 96–112)
Creat: 0.89 mg/dL (ref 0.40–1.20)
Glucose, Bld: 78 mg/dL (ref 70–99)
Potassium: 4.1 mEq/L (ref 3.5–5.3)
Sodium: 139 mEq/L (ref 135–145)

## 2010-09-02 NOTE — Assessment & Plan Note (Signed)
Fatigue x several months.  Likely not related to depression as pt does not endorse depressive symptoms.  She is getting 4-5.5 hours of sleep per night, which may be contributing to fatigue.  Will check cbc for anemia (likely iron def as pt is menstruating) will also check TSH and electrolytes today.  Will call pt with result tomorrow.  Will arrange for f/u appt after lab result and A&P.

## 2010-09-02 NOTE — Progress Notes (Signed)
  Subjective:    Patient ID: Audrey Hinton, female    DOB: [birth year], 40 y.o.   MRN: [id number]  HPI Fatigue: Present for several months, at least 6 months Weight: no change Sleep: sleeps less than 5 hours per night due to work schedule and taking children to school.  Describes good sleep but cannot fall back asleep once she awaken. No awakening during evening sleep.  She sometimes nap during the day for about 20 minutes or so.  Feels cold all the time. She has to drink warm water.  When she cold water she feels chills.  In the summer time she may be able to drink cold water No palpitations. No constipation.  No diarrhea.  No abn weight change.   Diet: normal (rice, protein like meats, vegetables).  No vitamins. Employment: 4pm - 12am for 5 days per week. Anhedonia: denies Menses: Every month.  4-5 days or sometimes 6-7. Usually lasts 6-7 days.  The first couple days changes pads 2-3 times, then on days 3-5 she changes pads 4-5 times per day.  No clots.   No dyspnea No depression.  No worries.  No stress.     Review of Systems Per hpi     Objective:   Physical Exam  General:  thin, alert, cooperative, NAD. Vitals reviewed.  Head:  no tenderness over sinuses Eyes:  EOMI, PERRL  Neck:  supple and no masses.  No thyroid nodule. Lungs:  normal respiratory effort, normal breath sounds, no crackles, and no wheezes.   Heart:  normal rate, regular rhythm, no murmur, no gallop, and no rub.   Abdomen:  soft, Nontender.  normal bowel sounds, no distention, no masses, no guarding, no rigidity, no hepatomegaly, and no splenomegaly.   Pulses:  2+ radial pulses bilaterally Extremities:  No edema  Neurologic:  alert & oriented X3, cranial nerves II-XII intact, and strength normal in all extremities.       Assessment & Plan:

## 2010-09-03 ENCOUNTER — Other Ambulatory Visit: Payer: Self-pay | Admitting: Family Medicine

## 2010-09-03 DIAGNOSIS — D509 Iron deficiency anemia, unspecified: Secondary | ICD-10-CM | POA: Insufficient documentation

## 2010-09-03 LAB — CBC
MCH: 20.2 pg — ABNORMAL LOW (ref 26.0–34.0)
MCHC: 32.2 g/dL (ref 30.0–36.0)
RDW: 15.7 % — ABNORMAL HIGH (ref 11.5–15.5)
WBC: 3.4 10*3/uL — ABNORMAL LOW (ref 4.0–10.5)

## 2010-09-03 MED ORDER — FERROUS SULFATE 325 (65 FE) MG PO TABS: ORAL_TABLET | ORAL | Status: DC

## 2010-09-03 MED ORDER — DOCUSATE SODIUM 100 MG PO CAPS: 100.0000 mg | ORAL_CAPSULE | Freq: Two times a day (BID) | ORAL | Status: AC

## 2010-09-03 NOTE — Telephone Encounter (Signed)
Pt complains of fatigue.  Labs showed low Hb with low MCV.  Likely iron def anemia.

## 2010-10-21 ENCOUNTER — Encounter: Payer: Self-pay | Admitting: Family Medicine

## 2014-06-29 ENCOUNTER — Encounter: Payer: Self-pay | Admitting: Family Medicine

## 2014-06-29 ENCOUNTER — Ambulatory Visit (INDEPENDENT_AMBULATORY_CARE_PROVIDER_SITE_OTHER): Payer: Commercial Managed Care - PPO | Admitting: Family Medicine

## 2014-06-29 VITALS — BP 134/73 | HR 72 | Temp 98.1°F | Ht 63.0 in | Wt 125.0 lb

## 2014-06-29 DIAGNOSIS — Z975 Presence of (intrauterine) contraceptive device: Secondary | ICD-10-CM | POA: Insufficient documentation

## 2014-06-29 NOTE — Patient Instructions (Signed)
Dear Audrey Hinton, Thank you for coming in to clinic today. It was good to meet you!  1. We will go ahead and remove and replace your IUD for you using Mirena IUD. This will be effective for 5 years. 2. Please look into Mirena more online and read more about it as discussed.  You are already scheduled for your IUD and Pap Smear appointment - Thursday 07/06/14 at 10:45am - (please arrive about 15-20 minutes early by 10:15 or 10:30 to get started)  Please schedule a follow-up appointment with Dr. Althea Charon in future as needed for physical.  If you have any other questions or concerns, please feel free to call the clinic to contact me. You may also schedule an earlier appointment if necessary.  However, if your symptoms get significantly worse, please go to the Emergency Department to seek immediate medical attention.  Saralyn Pilar, DO [facility]   Levonorgestrel intrauterine device (IUD) What is this medicine? LEVONORGESTREL IUD (LEE voe nor jes trel) is a contraceptive (birth control) device. The device is placed inside the uterus by a healthcare professional. It is used to prevent pregnancy and can also be used to treat heavy bleeding that occurs during your period. Depending on the device, it can be used for 3 to 5 years. This medicine may be used for other purposes; ask your health care provider or pharmacist if you have questions. COMMON BRAND NAME(S): Elveria Royals What should I tell my health care provider before I take this medicine? They need to know if you have any of these conditions: -abnormal Pap smear -cancer of the breast, uterus, or cervix -diabetes -endometritis -genital or pelvic infection now or in the past -have more than one sexual partner or your partner has more than one partner -heart disease -history of an ectopic or tubal pregnancy -immune system problems -IUD in place -liver disease or tumor -problems with blood clots  or take blood-thinners -use intravenous drugs -uterus of unusual shape -vaginal bleeding that has not been explained -an unusual or allergic reaction to levonorgestrel, other hormones, silicone, or polyethylene, medicines, foods, dyes, or preservatives -pregnant or trying to get pregnant -breast-feeding How should I use this medicine? This device is placed inside the uterus by a health care professional. Talk to your pediatrician regarding the use of this medicine in children. Special care may be needed. Overdosage: If you think you have taken too much of this medicine contact a poison control center or emergency room at once. NOTE: This medicine is only for you. Do not share this medicine with others. What if I miss a dose? This does not apply. What may interact with this medicine? Do not take this medicine with any of the following medications: -amprenavir -bosentan -fosamprenavir This medicine may also interact with the following medications: -aprepitant -barbiturate medicines for inducing sleep or treating seizures -bexarotene -griseofulvin -medicines to treat seizures like carbamazepine, ethotoin, felbamate, oxcarbazepine, phenytoin, topiramate -modafinil -pioglitazone -rifabutin -rifampin -rifapentine -some medicines to treat HIV infection like atazanavir, indinavir, lopinavir, nelfinavir, tipranavir, ritonavir -St. John's wort -warfarin This list may not describe all possible interactions. Give your health care provider a list of all the medicines, herbs, non-prescription drugs, or dietary supplements you use. Also tell them if you smoke, drink alcohol, or use illegal drugs. Some items may interact with your medicine. What should I watch for while using this medicine? Visit your doctor or health care professional for regular check ups. See your doctor if you or your  partner has sexual contact with others, becomes HIV positive, or gets a sexual transmitted disease. This  product does not protect you against HIV infection (AIDS) or other sexually transmitted diseases. You can check the placement of the IUD yourself by reaching up to the top of your vagina with clean fingers to feel the threads. Do not pull on the threads. It is a good habit to check placement after each menstrual period. Call your doctor right away if you feel more of the IUD than just the threads or if you cannot feel the threads at all. The IUD may come out by itself. You may become pregnant if the device comes out. If you notice that the IUD has come out use a backup birth control method like condoms and call your health care provider. Using tampons will not change the position of the IUD and are okay to use during your period. What side effects may I notice from receiving this medicine? Side effects that you should report to your doctor or health care professional as soon as possible: -allergic reactions like skin rash, itching or hives, swelling of the face, lips, or tongue -fever, flu-like symptoms -genital sores -high blood pressure -no menstrual period for 6 weeks during use -pain, swelling, warmth in the leg -pelvic pain or tenderness -severe or sudden headache -signs of pregnancy -stomach cramping -sudden shortness of breath -trouble with balance, talking, or walking -unusual vaginal bleeding, discharge -yellowing of the eyes or skin Side effects that usually do not require medical attention (report to your doctor or health care professional if they continue or are bothersome): -acne -breast pain -change in sex drive or performance -changes in weight -cramping, dizziness, or faintness while the device is being inserted -headache -irregular menstrual bleeding within first 3 to 6 months of use -nausea This list may not describe all possible side effects. Call your doctor for medical advice about side effects. You may report side effects to FDA at 1-800-FDA-1088. Where should I  keep my medicine? This does not apply. NOTE: This sheet is a summary. It may not cover all possible information. If you have questions about this medicine, talk to your doctor, pharmacist, or health care provider.  2015, Elsevier/Gold Standard. (2011-07-10 13:54:04)

## 2014-06-29 NOTE — Assessment & Plan Note (Signed)
Currently with IUD in place (x 9 years), no complaints, plan for removal / replacement - Due for Pap Smear  Plan: 1. Scheduled f/u for 1 week for IUD removal and replacement with Mirena IUD. Counseled on benefits / risks of Mirena IUD and patient understands procedure. 2. Also, will perform Pap Smear at that time prior to IUD removal / insertion

## 2014-06-29 NOTE — Progress Notes (Signed)
   Subjective:    Patient ID: Audrey Hinton, female    DOB: Mar 29, 1971, 44 y.o.   MRN: [id number]  HPI  IUD CHECK: - Patient presents to have her IUD checked today to see if it is still in place and still effective, and to ask about alternative forms of birth control. [facility]. Does not have card and is unsure which type of IUD, but was told it was "good for 10 years". Initial indication was for contraception, denies complaints of heavy menses. - Interested in replacement of IUD. Not planning for any future pregnancy. - Denies any complaints with IUD, abdominal or pelvic pain, spotting/breakthrough bleeding  HM: - [facility] not [facility] years - Due for Pap Smear, last 12/2009 (negative)  I have reviewed and updated the following as appropriate: allergies and current medications  Social Hx: - Work 2 jobs ([employer]) - Married, has 4 children  Review of Systems  See above HPI    Objective:   Physical Exam  BP 134/73 mmHg  Pulse 72  Temp(Src) 98.1 F (36.7 C) (Oral)  Ht 5\' 3"  (1.6 m)  Wt 125 lb (56.7 kg)  BMI 22.15 kg/m2  LMP 06/20/2014  Gen - well-appearing, NAD HEENT - MMM Abd - soft, NTND, no masses, +active BS Skin - warm, dry, no rashes     Assessment & Plan:   See specific A&P problem list for details.

## 2014-07-06 ENCOUNTER — Ambulatory Visit: Payer: Commercial Managed Care - PPO | Admitting: Family Medicine

## 2014-07-10 ENCOUNTER — Encounter: Payer: Self-pay | Admitting: Family Medicine

## 2014-07-10 ENCOUNTER — Other Ambulatory Visit (HOSPITAL_COMMUNITY)
Admission: RE | Admit: 2014-07-10 | Discharge: 2014-07-10 | Disposition: A | Discharge status: Home / Self Care | Payer: 59 | Source: Ambulatory Visit | Attending: Family Medicine | Admitting: Family Medicine

## 2014-07-10 ENCOUNTER — Ambulatory Visit (INDEPENDENT_AMBULATORY_CARE_PROVIDER_SITE_OTHER): Payer: Commercial Managed Care - PPO | Admitting: Family Medicine

## 2014-07-10 VITALS — BP 117/70 | HR 77 | Temp 98.3°F | Ht 63.0 in | Wt 125.0 lb

## 2014-07-10 DIAGNOSIS — Z1151 Encounter for screening for human papillomavirus (HPV): Secondary | ICD-10-CM | POA: Insufficient documentation

## 2014-07-10 DIAGNOSIS — Z975 Presence of (intrauterine) contraceptive device: Secondary | ICD-10-CM

## 2014-07-10 DIAGNOSIS — Z01419 Encounter for gynecological examination (general) (routine) without abnormal findings: Secondary | ICD-10-CM | POA: Diagnosis not present

## 2014-07-10 DIAGNOSIS — Z124 Encounter for screening for malignant neoplasm of cervix: Secondary | ICD-10-CM

## 2014-07-10 DIAGNOSIS — Z30014 Encounter for initial prescription of intrauterine contraceptive device: Secondary | ICD-10-CM

## 2014-07-10 DIAGNOSIS — Z30430 Encounter for insertion of intrauterine contraceptive device: Secondary | ICD-10-CM | POA: Insufficient documentation

## 2014-07-10 LAB — POCT URINE PREGNANCY: Preg Test, Ur: NEGATIVE

## 2014-07-10 MED ORDER — LEVONORGESTREL 20 MCG/24HR IU IUD
INTRAUTERINE_SYSTEM | Freq: Once | INTRAUTERINE | Status: AC
  Administered 2014-07-10: 1 via INTRAUTERINE

## 2014-07-10 NOTE — Assessment & Plan Note (Addendum)
Removed old IUD (>9 years), placed new Mirena IUD today. Tolerated procedure well without complications (see procedure note for details) (slightly prolonged due to difficulty with locating appropriate view of anterior cervix, assisted by Dr. Leveda AnnaHensel) - Patient counseled on benefits / risks Mirena IUD, signed consent obtained - Urine pregnancy test (negative, 07/10/14) - Additionally pap smear collected prior to start of procedure  Plan: 1. Advised return 6-8 week follow-up to confirm placement, re-evaluate, then plan for annual follow-up 2. Card given for Mirena IUD placement - anticipate removal/replacement in 5 years January 2021

## 2014-07-10 NOTE — Progress Notes (Signed)
   Subjective:    Patient ID: Audrey Hinton, female    DOB: 02/12/1971, 44 y.o.   MRN: [id number]  HPI  IUD REMOVAL / INSERTION: - Patient presents today for removal of existing IUD (>9 years), and for placement of new Mirena IUD. [facility] for evaluation of IUD and counseling on contraception including Mirena IUD. - Today without concerns or new symptoms. 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 Vaginal canal without lesions. Some difficulty locating significantly anterior cervix with anteroflex/anterograde uterus (confirmed on bi-manual exam), normal appearing cervix with existing IUD strings identified, without lesions or bleeding. Physiologic discharge on exam. Pap smear collected.  Exam chaperoned by Garen GramsAsha Benton, LPN and procedures supervised and assisted by Dr. Leveda AnnaHensel  PROCEDURE NOTE: IUD REMOVAL / MIRENA IUD PLACEMENT The risks and benefits of the method and placement have been thouroughly reviewed with the patient and all questions were answered. Consent signed and dated, to be scanned. Time out was performed. A speculum was placed to obtain view of cervix for IUD strings, identified, grasped with long hemostat and entire IUD device was removed with gentle traction and disposed of. Replacement of speculum  and reposition to obtain appropriate view of cervix (following bi-manual exam, determined anterior position of cervix). The cervix was prepped using Betadine swabs x 3. The uterus was sounded to 7.5cm. The IUD was inserted to 7 cm.  It was pulled back 1 cm and the IUD was disengaged and then reintroduced to maximum depth, inserter device was gently removed without any sign of IUD device, and strings noted to be in appropriate position. The strings were trimmed to aprox 3-4 cm. Speculum removed.     Assessment & Plan:   See specific A&P problem list for details.

## 2014-07-10 NOTE — Patient Instructions (Signed)
Dear Audrey Hinton, Thank you for coming in to clinic today.  1. Today Pap Smear performed - we will mail you a letter with results. This is due every 3-5 years if normal. 2. Removed old IUD. 3. Placed new Mirena IUD 4. You will have some bleeding and cramping today, then likely spotting tomorrow and it should decrease soon. IUD was placed appropriately without difficulty once good view of cervix obtained. Strings were trimmed. You may feel for the strings on your own to check correction position.  Please schedule a follow-up appointment with Dr. Althea CharonKaramalegos in 6 to 8 weeks to re-check string position and for general follow-up. Otherwise, we will check on it yearly. It is good for 5 years.  If you have any other questions or concerns, please feel free to call the clinic to contact me. You may also schedule an earlier appointment if necessary.  However, if your symptoms get significantly worse, please go to the Emergency Department to seek immediate medical attention.  Saralyn PilarAlexander Derrell Milanes, DO [facility]

## 2014-07-12 LAB — CYTOLOGY - PAP

## 2014-07-18 ENCOUNTER — Encounter: Payer: Self-pay | Admitting: Family Medicine

## 2014-09-06 ENCOUNTER — Encounter: Payer: Self-pay | Admitting: Family Medicine

## 2014-09-06 ENCOUNTER — Ambulatory Visit (INDEPENDENT_AMBULATORY_CARE_PROVIDER_SITE_OTHER): Payer: 59 | Admitting: Family Medicine

## 2014-09-06 VITALS — BP 103/54 | HR 63 | Temp 97.9°F | Ht 63.0 in | Wt 124.5 lb

## 2014-09-06 DIAGNOSIS — D509 Iron deficiency anemia, unspecified: Secondary | ICD-10-CM

## 2014-09-06 DIAGNOSIS — Z975 Presence of (intrauterine) contraceptive device: Secondary | ICD-10-CM

## 2014-09-06 DIAGNOSIS — Z0000 Encounter for general adult medical examination without abnormal findings: Secondary | ICD-10-CM | POA: Insufficient documentation

## 2014-09-06 DIAGNOSIS — Z23 Encounter for immunization: Secondary | ICD-10-CM

## 2014-09-06 DIAGNOSIS — K219 Gastro-esophageal reflux disease without esophagitis: Secondary | ICD-10-CM

## 2014-09-06 DIAGNOSIS — Z114 Encounter for screening for human immunodeficiency virus [HIV]: Secondary | ICD-10-CM

## 2014-09-06 LAB — CBC
HEMATOCRIT: 38.7 % (ref 36.0–46.0)
Hemoglobin: 11.7 g/dL — ABNORMAL LOW (ref 12.0–15.0)
MCH: 20.6 pg — ABNORMAL LOW (ref 26.0–34.0)
MCHC: 30.2 g/dL (ref 30.0–36.0)
MCV: 68.3 fL — AB (ref 78.0–100.0)
PLATELETS: 254 10*3/uL (ref 150–400)
RBC: 5.67 MIL/uL — ABNORMAL HIGH (ref 3.87–5.11)
RDW: 19.6 % — AB (ref 11.5–15.5)
WBC: 3.8 10*3/uL — ABNORMAL LOW (ref 4.0–10.5)

## 2014-09-06 MED ORDER — FERROUS SULFATE 325 (65 FE) MG PO TABS: 325.0000 mg | ORAL_TABLET | Freq: Two times a day (BID) | ORAL | Status: DC

## 2014-09-06 NOTE — Assessment & Plan Note (Signed)
-   Check routine HIV screening (no risk factors) - Receive TDap today (09/06/14) - Received Influenza vaccine per employee health (03/2014) - Last Pap 07/10/14 - negative malignancy and HPV (not detected) - Next due 06/2019

## 2014-09-06 NOTE — Progress Notes (Signed)
   Subjective:    Patient ID: Audrey Hinton, female    DOB: Dec 29, 1970, 44 y.o.   MRN: [id number]  Patient presents for physical exam. HPI  IUD PLACEMENT FOLLOW-UP / VAGINAL SPOTTING - Last visit 07/10/14 for old IUD removal and new Mirena IUD insertion. Previously had original IUD >9 years. Indication is for contraception primarily. Did not have problems with abnormal uterine bleeding or heavy periods previously. - Today reports that she has had continued bleeding small amount every day since insertion. Amount of blood flow is small amount (regular dark period blood color, not bright red), wears pads changes once daily (does not soak through, only small amt). No other associated symptoms. Initially had pelvic cramping 1-2 days after insertion (since resolved). - Checked strings by self exam prior to visit, and has confirmed she can feel strings - Sexually active, denies any pain with intercourse - Denies any fevers/chills, pelvic pain, abdominal or flank pain, dysuria, vaginal discharge  H/o IRON DEFICIENCY ANEMIA: - Last CBC with Hgb check 2012 at 10.4 - Previously prescribed ferrous sulfate 325mg  BID for chronic iron deficiency anemia >5 years. She stated that iron supplement had caused GI discomfort before. No longer taking any iron tablets at this time, stopped for past 1-2 years - Denies any CP, SOB, fatigue, dizziness / lightheadedness, rectal bleeding  GERD: - Continues on Nexium 40mg  daily. 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 Normal work of breathing. Abd - soft, NTND, no masses, +active BS Ext - non-tender, no edema, peripheral pulses intact +2 b/l Skin - warm, dry, no rashes Neuro - awake, alert, grossly non-focal  Pelvic Exam - Normal external female genitalia. Vaginal canal without lesions. Normal appearing cervix, without lesions. Small amount old blood in vaginal vault with small amount at cervical os. No vaginal discharge on exam. Mirena IUD strings in place extending approx 3 cm out of cervical os. Bimanual exam without masses or cervical motion tenderness.  Pelvic exam chaperoned by Gilberto BetterMichelle Simpson, CMA     Assessment & Plan:   See specific A&P problem list for details.

## 2014-09-06 NOTE — Patient Instructions (Signed)
Dear Audrey Hinton, Thank you for coming in to clinic today.  1. Mirena IUD is in the right place. It looks normal on exam. Overall your bleeding is still within the normal range. This may continue for up to 6 months. It should gradually get better and you may develop a normal period. 2. Given your history of Low Iron - we will check Blood Count today. 3. I went ahead and sent in prescription for Iron Supplement 325mg  twice daily (take WITH FOOD) - you may go ahead and start this ONCE daily for 1 to 2 weeks, then if tolerated - take TWICE daily. (take this 2 hours before or 4 hours after Nexium for stomach acid) 4. If your bleeding continues and does not improve - please call back over next 3-4 months, and I can prescribe 1 month of Birth Control Pills to help regulate your cycle.  You will get Tdap vaccine today.  You already have received Influenza vaccine by Employee Health  Routine HIV screening lab today. We will contact you with results by phone or mail over next 1 week.  Please schedule a follow-up appointment with me for annual physical next year as needed. Or follow-up for Low Iron / Bleeding if these symptoms persist over next 3 - 6 months.  If you have any other questions or concerns, please feel free to call the clinic to contact me. You may also schedule an earlier appointment if necessary.  However, if your symptoms get significantly worse, please go to the Emergency Department to seek immediate medical attention.  Saralyn PilarAlexander Bay Jarquin, DO [facility]

## 2014-09-06 NOTE — Progress Notes (Signed)
I was the preceptor for this visit. 

## 2014-09-06 NOTE — Assessment & Plan Note (Signed)
Mirena IUD placed 07/10/14. Persistent mild vaginal bleeding / spotting daily x 2 months following placement, no other associated symptoms (no abdominal or pelvic pain). - Note previously had regular periods, indication for Mirena is contraception. Not for AUB. - Pelvic Exam - confirm appropriate placement of Mirena with strings 3cm out of cervical os, old vaginal bleed in vault. No lesions  Plan: 1. Reassurance, Mirena in correct place. Likely breakthrough bleeding due to normal change with hormonal therapy. Advised pt we will continue to monitor, may reduce by 3 to 6 months 2. Check CBC (h/o iron deficiency anemia) and rx ferrous sulfate BID WC 3. Consider 1 month low dose OCP to regulate bleeding / cycle in future if persistent bleeding 4. RTC 3-6 months if persistent bleeding

## 2014-09-06 NOTE — Assessment & Plan Note (Signed)
-   Continue Nexium 40mg daily 

## 2014-09-06 NOTE — Assessment & Plan Note (Addendum)
Chronic h/o iron deficiency anemia, last Hgb 10.4 in 2012, no longer on ferrous sulfate. Concern with bleeding daily following Mirena IUD, seems to be appropriate amount and within range of normal effects of Mirena IUD. - Asymptomatic  Plan: 1. Check CBC today - improved Hgb 11.7 (from 10.4, last 4 years ago) 2. Empirically resume ferrous sulfate 325mg  PO WC daily 1-2 weeks, increase to BID WC in 2 weeks if tolerated (advised to take separate from PPI 2 hr before or 4 hr after) 3. Follow-up 3-6 months PRN  UPDATE 3/17 - Called patient to review lab results, notified of negative HIV. Specifically discussed CBC with improved Hgb to 11.7 (from 10.4), previously sent rx ferrous sulfate 325mg  PO BID WC (#60), given results edited prescription to 325mg  daily (#30 tabs, 5 refills) -advised her to take daily for 6 months. Will follow-up. Questions answered.

## 2014-09-07 LAB — HIV ANTIBODY (ROUTINE TESTING W REFLEX): HIV: NONREACTIVE

## 2014-09-07 MED ORDER — FERROUS SULFATE 325 (65 FE) MG PO TABS: 325.0000 mg | ORAL_TABLET | Freq: Every day | ORAL | Status: DC

## 2014-09-07 NOTE — Addendum Note (Signed)
Addended by: Smitty CordsKARAMALEGOS, ALEXANDER J on: 09/07/2014 05:01 PM   Modules accepted: Orders

## 2014-12-27 ENCOUNTER — Ambulatory Visit (INDEPENDENT_AMBULATORY_CARE_PROVIDER_SITE_OTHER): Payer: 59 | Admitting: Family Medicine

## 2014-12-27 ENCOUNTER — Encounter: Payer: Self-pay | Admitting: Family Medicine

## 2014-12-27 VITALS — BP 106/48 | HR 60 | Temp 98.0°F | Ht 63.0 in | Wt 122.5 lb

## 2014-12-27 DIAGNOSIS — M5441 Lumbago with sciatica, right side: Secondary | ICD-10-CM | POA: Diagnosis not present

## 2014-12-27 DIAGNOSIS — Z975 Presence of (intrauterine) contraceptive device: Secondary | ICD-10-CM | POA: Diagnosis not present

## 2014-12-27 MED ORDER — NAPROXEN 500 MG PO TABS: 500.0000 mg | ORAL_TABLET | Freq: Two times a day (BID) | ORAL | Status: DC

## 2014-12-27 MED ORDER — CYCLOBENZAPRINE HCL 10 MG PO TABS: 5.0000 mg | ORAL_TABLET | Freq: Three times a day (TID) | ORAL | Status: DC | PRN

## 2014-12-27 NOTE — Patient Instructions (Addendum)
Dear Audrey Hinton, Thank you for coming in to clinic today. It was good to see you!  1. For your Back Pain - I think that this is due to Muscle Spasms and is related to overuse and strain at your job. Your Sciatic Nerve can be affected causing some of your radiation and numbness down your Right Leg. 2. Start with anti-inflammatory Naprosyn (Naproxen) 500mg  twice daily (12 hrs apart, with food, breakfast and dinner) every day for next 2 to 4 weeks if helping, then can use only as needed 3. Start Cyclobenzapine (Flexeril) 10mg  tablets - cut in half for 5mg  at night for muscle relaxant - may make you sedated or sleepy (be careful driving or working on this) if tolerated you can take every 8 hours, half or whole tab 4. May use Tylenol Extra Str 1-2 tablets every 6 hours as needed 5. Start using heating pad on your lower back 1-2x daily for few weeks  I do not think that this is related to your IUD. It sounds like it is working just fine.  Please schedule a follow-up appointment with Dr. Althea CharonKaramalegos in 1 month for re-evaluation.  If you have any other questions or concerns, please feel free to call the clinic to contact me. You may also schedule an earlier appointment if necessary.  However, if your symptoms get significantly worse, please go to the Emergency Department to seek immediate medical attention.  Saralyn PilarAlexander Laykin Rainone, DO [facility]

## 2014-12-27 NOTE — Progress Notes (Signed)
   Subjective:    Patient ID: Audrey Hinton, female    DOB: [birth year], 44 y.o.   MRN: [id number]  HPI  LOW BACK PAIN / LEG NUMBNESS: - Reported no known history of LBP, prior surgeries, no known injury, fall or trauma - Currently presents with new complaint of LBP. 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 Significant improvement with few days of rest off of work. Additionally associated radiation down Right leg, mostly at night with numbness radiating down Right leg down to toes when sleeps then resolves. - Has not tried any medicines, topical or conservative therapies - Denies any fevers/chills, weight loss, weakness, saddle anesthesia, urinary/bowel incontinence or urinary retention  IUD PLACEMENT FOLLOW-UP / VAGINAL SPOTTING: - Presents today to discuss LBP, and initially concerned that symptoms may be related to IUD placement as she stated started 1 week after. Previously followed for some episodes of increased vaginal bleeding 1-2 months after placement of Mirena. - Today reports menstrual cycle has normalized. LMP 12/20/14, lighter periods, regular monthly. No further concerns. 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 Minimal +TTP localized to Right lower lumbar paraspinal muscles without appreciable spasm/hypertonicity. Seated SLR negative and supine SLR negative for radiculopathy. Patellar and achilles DTR intact +2 b/l Abd - soft, no lower abdominal or suprapubic tenderness to palpation, non-distended Ext - non-tender, no edema, peripheral pulses intact +2 b/l Skin - warm, dry, no rashes Neuro - awake, alert, grossly non-focal, patellar and achilles DTR intact +2, muscle str 5/5 grip, knee flex, ankle dorsiflexion, intact distal sensation to light touch and temp, gait normal     Assessment & Plan:   See specific A&P problem list for details.

## 2014-12-27 NOTE — Assessment & Plan Note (Signed)
New complaint of chronic (since 06/2014, intermittently) R-LBP with some sciatica symptoms to RLE. 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 Numbness within RLE associated with sleeping only. Pt initially concerned about LBP after IUD placed 06/2014, however seems unrelated, no other concerns with IUD. - No red flag symptoms or complications - Inadequate conservative medical therapy  Plan: 1. Start anti-inflammatory trial with rx Naprosyn 500mg  BID wc x 2-4 weeks, then PRN 2. Start muscle relaxant with Flexeril 10mg  tabs - take 5-10mg  up to TID PRN, titrate up as tolerated 3. May use Tylenol PRN for breakthrough 4. Encouraged use of heating pad 1-2x daily for now then PRN 5. RTC 1 month, re-evaluation. If not improved consider X-ray imaging given >6 months. Likely pursue trial of PT for strengthening. May need extended time off work for rest.

## 2014-12-27 NOTE — Assessment & Plan Note (Signed)
Stable. Mirena IUD remains in place (since 06/2014). Menstrual cycle has since normalized, no longer increased flow. Now reduced flow, regular, monthly. - Unlikely to be etiology of LBP  Plan: 1. Keep Mirena IUD in place, monitor

## 2015-06-27 ENCOUNTER — Ambulatory Visit (INDEPENDENT_AMBULATORY_CARE_PROVIDER_SITE_OTHER): Payer: 59 | Admitting: Family Medicine

## 2015-06-27 ENCOUNTER — Encounter: Payer: Self-pay | Admitting: Family Medicine

## 2015-06-27 VITALS — BP 120/51 | HR 72 | Temp 98.9°F | Wt 119.0 lb

## 2015-06-27 DIAGNOSIS — R1030 Lower abdominal pain, unspecified: Secondary | ICD-10-CM

## 2015-06-27 DIAGNOSIS — K219 Gastro-esophageal reflux disease without esophagitis: Secondary | ICD-10-CM

## 2015-06-27 LAB — POCT URINE PREGNANCY: PREG TEST UR: NEGATIVE

## 2015-06-27 MED ORDER — ESOMEPRAZOLE MAGNESIUM 40 MG PO CPDR: 40.0000 mg | DELAYED_RELEASE_CAPSULE | Freq: Every day | ORAL | Status: DC

## 2015-06-27 MED FILL — ESOMEPRAZOLE MAG DR 40 MG C: 40 | 30 days supply | Qty: 30 | Fill #0

## 2015-06-27 NOTE — Assessment & Plan Note (Signed)
Consistent with GERD since self-discontinued PPI months ago, less likely viral syndrome but possible.  Plan: 1. Checked Urine Pregnancy test - NEGATIVE today 06/27/15, reassurance with IUD in place 2. Resume Nexium PPI 40mg  daily 3. GERD diet instructions given 4. RTC 4 weeks PRN

## 2015-06-27 NOTE — Patient Instructions (Signed)
Thank you for coming in to clinic today.  1. Your Urine pregnancy test was NEGATIVE. Your IUD is working well and I do not think that this is related. 2. Most likely Acid Reflux (GERD) flare, we will resume Nexium 40mg  daily, take 30 min before first meal of day every day for 1 month, can continue longer if helping, otherwise can try to go off of this medicine again. - Recommend smaller frequent meals, don't go more than 5 hours without eating anything (hunger can make this worse), try to avoid spicy or greasy foods, reduce caffeine and sodas, reduce alcohol, try not to lay down to sleep within 30 min of eating, especially a larger meal. Try to eat a meal at work between 10pm to midnight so you don't have to eat a full meal when you get home, small snack is fine at home  Please schedule a follow-up appointment with Dr Althea CharonKaramalegos in 1 to 2 months for GERD  If you have any other questions or concerns, please feel free to call the clinic to contact me. You may also schedule an earlier appointment if necessary.  However, if your symptoms get significantly worse, please go to the Emergency Department to seek immediate medical attention.  Saralyn PilarAlexander Broughton Eppinger, DO [facility]

## 2015-06-27 NOTE — Progress Notes (Signed)
Subjective:    Patient ID: Audrey Hinton, female    DOB: September 09, 1970, 45 y.o.   MRN: [id number]  Audrey FatJeng Schwartzkopf is a 45 y.o. female presenting on 06/27/2015 for Abdominal Pain and Nausea  HPI  NAUSEA / VOMITING / ABDOMINAL PAIN: - Reported symptoms started within past 2-3 week with some nausea, dizziness, abdominal bloating and gas for 1-2 days, then gradual improvement over few days. Reduced appetite but still tolerating PO well. - history of GERD was on Nexium in past with improvement. Does work 2nd shift and can eat late. - Last BM today, normal without any problems, soft - No sick contacts - Now on IUD mirena >6 months, no longer having menstrual cycle or spotting, concerned she may be pregnant, requesting pregnancy test today - Denies fevers/chills, sweating, abdominal pain, diarrhea, vomiting, constipation, blood in stool or dark stools  Past Medical History  Diagnosis Date  . GERD (gastroesophageal reflux disease)     Social History   Social History  . Marital Status: Married    Spouse Name: N/A  . Number of Children: N/A  . Years of Education: N/A   Occupational History  . Not on file.   Social History Main Topics  . Smoking status: Never Smoker   . Smokeless tobacco: Never Used  . Alcohol Use: No  . Drug Use: No  . Sexual Activity: Yes    Birth Control/ Protection: IUD   Other Topics Concern  . Not on file   Social History Narrative   [place] with Husband Denton Lank(Krounh Y) and 4 children (Bet Bir 19, Doet Treiber 16, Suu Sirmon 13, Ammy Pasquariello 5)   Emigrated to [place] 2002.   [place], speaks some [place], also speaks [place].    Works in housekeeping at [employer]   No smoking/alcohol use/no drugs    Current Outpatient Prescriptions on File Prior to Visit  Medication Sig  . cyclobenzaprine (FLEXERIL) 10 MG tablet Take 0.5-1 tablets (5-10 mg total) by mouth 3 (three) times daily as needed for muscle spasms. (Patient not taking: Reported on 06/27/2015)    No current facility-administered medications on file prior to visit.    Review of Systems Per HPI unless specifically indicated above     Objective:    BP 120/51 mmHg  Pulse 72  Temp(Src) 98.9 F (37.2 C) (Oral)  Wt 119 lb (53.978 kg)  Wt Readings from Last 3 Encounters:  06/27/15 119 lb (53.978 kg)  12/27/14 122 lb 8 oz (55.566 kg)  09/06/14 124 lb 8 oz (56.473 kg)    Physical Exam  Constitutional: She appears well-developed and well-nourished. No distress.  Well-appearing  HENT:  Head: Normocephalic and atraumatic.  Mouth/Throat: Oropharynx is clear and moist.  Cardiovascular: Normal rate, regular rhythm, normal heart sounds and intact distal pulses.   No murmur heard. Pulmonary/Chest: Effort normal and breath sounds normal. No respiratory distress. She has no wheezes. She has no rales.  Abdominal: Soft. Bowel sounds are normal. She exhibits no distension and no mass. There is tenderness (mild mid abdominal deep palpation only). There is no rebound.  Musculoskeletal: She exhibits no edema.  Neurological: She is alert.  Skin: Skin is warm and dry. She is not diaphoretic.  Nursing note and vitals reviewed.  Results for orders placed or performed in visit on 06/27/15  POCT urine pregnancy  Result Value Ref Range   Preg Test, Ur Negative Negative      Assessment & Plan:   Problem List Items Addressed This  Visit    GERD - Primary    Consistent with GERD since self-discontinued PPI months ago, less likely viral syndrome but possible.  Plan: 1. Checked Urine Pregnancy test - NEGATIVE today 06/27/15, reassurance with IUD in place 2. Resume Nexium PPI 40mg  daily 3. GERD diet instructions given 4. RTC 4 weeks PRN       Relevant Medications   esomeprazole (NEXIUM) 40 MG capsule    Other Visit Diagnoses    Lower abdominal pain        Relevant Orders    POCT urine pregnancy (Completed)       Meds ordered this encounter  Medications  . esomeprazole (NEXIUM) 40 MG  capsule    Sig: Take 1 capsule (40 mg total) by mouth daily. 30 min before eating 1st meal of day.    Dispense:  30 capsule    Refill:  2      Follow up plan: No Follow-up on file.  Saralyn Pilar, DO [facility], PGY-3

## 2016-07-10 ENCOUNTER — Ambulatory Visit: Payer: 59 | Admitting: Internal Medicine

## 2016-10-03 ENCOUNTER — Encounter: Payer: Self-pay | Admitting: Internal Medicine

## 2016-10-03 ENCOUNTER — Ambulatory Visit (INDEPENDENT_AMBULATORY_CARE_PROVIDER_SITE_OTHER): Payer: 59 | Admitting: Internal Medicine

## 2016-10-03 VITALS — BP 108/70 | HR 71 | Temp 98.4°F | Ht 63.0 in | Wt 127.0 lb

## 2016-10-03 DIAGNOSIS — Z538 Procedure and treatment not carried out for other reasons: Secondary | ICD-10-CM

## 2016-10-03 DIAGNOSIS — Z975 Presence of (intrauterine) contraceptive device: Secondary | ICD-10-CM | POA: Diagnosis not present

## 2016-10-03 NOTE — Patient Instructions (Addendum)
It was nice meeting you today Ms. Head!  I have placed a referral to gynecology for you. You will be called by the gynecologist's office with the date and time of your appointment.   If you have any pain after the procedure today, you can take ibuprofen or Tylenol.   If you have any questions or concerns, please feel free to call the clinic.   Be well,  Dr. Natale Milch

## 2016-10-03 NOTE — Progress Notes (Signed)
   Subjective:   Patient: Audrey Hinton       Birthdate: [birth year]       MRN: [id number]      HPI  Audrey Hinton is a 46 y.o. female presenting for IUD removal.   IUD was placed in November 2016. Patient wishing for removal today due to leg and back pain. Patient says that her lower back and leg have been hurting recently, and she has some lateral leg numbness. She believes this is due to [place]. She would like to have Mirena removed to see if the pain continues. If the pain does continue after a few months despite Mirena removal, she would like to have it replaced. Patient currently is only having a couple days of menstrual bleeding every few months. She would like to try Depo for contraception after Mirena removal.   Smoking status reviewed. Patient is never smoker.   Review of Systems See HPI.     Objective:  Physical Exam  Constitutional: She is oriented to person, place, and time and well-developed, well-nourished, and in no distress.  HENT:  Head: Normocephalic and atraumatic.  Eyes: Conjunctivae and EOM are normal. Right eye exhibits no discharge. Left eye exhibits no discharge.  Pulmonary/Chest: Effort normal. No respiratory distress.  Genitourinary: No vaginal discharge found.  Genitourinary Comments: Performed with chaperone in room. Cervix very anterior but able to visualize, however unable to visualize Mirena strings. Exam assisted by Dr. Jennette Kettle.   Musculoskeletal: Normal range of motion.  5/5 LE strength bilaterally  Neurological: She is alert and oriented to person, place, and time.  Skin: Skin is warm and dry.  Psychiatric: Affect and judgment normal.      Assessment & Plan:  IUD (intrauterine device) in place Unable to remove today as strings could not be visualized by either myself or Dr. Jennette Kettle. Will refer to gynecologist for another attempt at removal.  Of note, explained to patient that leg and back pain is almost certainly not due to Mirena. Recommended treating pain  rather than removing Mirena, however patient declined and insisted she wanted Mirena removed. Explained to patient that removing now and reinserting in a couple months is not an option unless she wants to pay out of pocket. Patient voiced understanding.  - Referral to gynecology placed - Precepted with Dr. Jennette Kettle.    Tarri Abernethy, MD, MPH PGY-2 [facility] Pager [phone]

## 2016-10-03 NOTE — Assessment & Plan Note (Signed)
Unable to remove today as strings could not be visualized by either myself or Dr. Jennette Kettle. Will refer to gynecologist for another attempt at removal.  Of note, explained to patient that leg and back pain is almost certainly not due to Mirena. Recommended treating pain rather than removing Mirena, however patient declined and insisted she wanted Mirena removed. Explained to patient that removing now and reinserting in a couple months is not an option unless she wants to pay out of pocket. Patient voiced understanding.  - Referral to gynecology placed

## 2016-11-03 ENCOUNTER — Ambulatory Visit (INDEPENDENT_AMBULATORY_CARE_PROVIDER_SITE_OTHER): Payer: 59 | Admitting: Obstetrics & Gynecology

## 2016-11-03 ENCOUNTER — Encounter: Payer: Self-pay | Admitting: Obstetrics & Gynecology

## 2016-11-03 VITALS — BP 136/84 | Ht 62.75 in | Wt 127.0 lb

## 2016-11-03 DIAGNOSIS — Z30013 Encounter for initial prescription of injectable contraceptive: Secondary | ICD-10-CM | POA: Diagnosis not present

## 2016-11-03 DIAGNOSIS — R102 Pelvic and perineal pain: Secondary | ICD-10-CM

## 2016-11-03 DIAGNOSIS — Z30432 Encounter for removal of intrauterine contraceptive device: Secondary | ICD-10-CM

## 2016-11-03 MED ORDER — MEDROXYPROGESTERONE ACETATE 150 MG/ML IM SUSP: 150.0000 mg | Freq: Once | INTRAMUSCULAR | 4 refills | Status: DC

## 2016-11-03 MED ORDER — MEDROXYPROGESTERONE ACETATE 150 MG/ML IM SUSP
150.0000 mg | Freq: Once | INTRAMUSCULAR | Status: AC
  Administered 2016-11-03: 150 mg via INTRAMUSCULAR

## 2016-11-03 NOTE — Progress Notes (Signed)
      Audrey MuirJeng Hinton 16-Sep-1970 [id number]        46 y.o.  G5P0014 married  RP:  Lt pelvic pain radiating to left ant/side leg down to the knee x 2-3 weeks  Desires Mirena IUD removal because she thinks it is causing the pain.  Pain is worse when sits down.  No numbness or tingling in Left leg/foot.  Had Paragard x 10 yrs, then switched to Mirena IUD x 2 years.  No menses on Mirena.  No abnormal vaginal d/c.  No fever.  Would like to switch to DepoProvera which she tolerated well in the past.  Would like husband to have a Vasectomy eventually.  Last pap 06/2014 normal.  No recent mammo.   Past medical history,surgical history, problem list, medications, allergies, family history and social history were all reviewed and documented in the EPIC chart.  Directed ROS with pertinent positives and negatives documented in the history of present illness/assessment and plan.  Exam:  Vitals:   11/03/16 0947  BP: 136/84  Weight: 127 lb (57.6 kg)  Height: 5' 2.75" (1.594 m)   General appearance:  Normal  IUD removal:  Strings visible.  Hurricane spray on cervix.  Easy removal by pulling on strings.  IUD shown to patient and discarded.    Gyn exam:  Vulva normal, cervix and vagina normal.  Normal secretions.                    Bimanual exam:  Uterus RV, normal volume.  No adnexal mass, NT.  Assessment/Plan:  46 y.o. G5P0014  1. Female pelvic pain Left Pelvic pain x 2-3 weeks, radiating to Left thigh.  No Neuro Sx otherwise.  Gyn exam normal today.  Mirena IUD removed at patient's request.  If pain persists, Pelvic US to complete evaluation and referral to Ortho to r/o Lt sciatalgia or hip issue.  F/U Annual/Gyn exam.  2. Encounter for initial prescription of injectable contraceptive Decision to start DepoProvera for contraception.  Risks/Benefits reviewed.  Patient familiar with DepoProvera and did well with it in the past.  [facility] phone # given for husband to consult for  Vasectomy.  3. Encounter for removal of intrauterine contraceptive device (IUD) Easy removal of Mirena IUD at patient's request.  Informed prior to removal that the IUD was probably not the cause of her pain.  If pain persists, will complete evaluation with a Pelvic US and  recommend Orthopedic referral to r/o Left Sciatic nerve pain or Lt hip pain.    Counseling on above issues >50% x 30 minutes.  Genia DelMarie-Lyne Chloeanne Poteet MD, 9:58 AM 11/03/2016

## 2016-11-03 NOTE — Patient Instructions (Signed)
1. Female pelvic pain Left Pelvic pain x 2-3 weeks, radiating to Left thigh.  No Neuro Sx otherwise.  Gyn exam normal today.  Mirena IUD removed at patient's request.  If pain persists, Pelvic US to complete evaluation and referral to Ortho to r/o Lt sciatalgia or hip issue.  F/U Annual/Gyn exam.  2. Encounter for initial prescription of injectable contraceptive Decision to start DepoProvera for contraception.  Risks/Benefits reviewed.  Patient familiar with DepoProvera and did well with it in the past.  [facility] phone # given for Vasectomy consult.  3. Encounter for removal of intrauterine contraceptive device (IUD) Easy removal of Mirena IUD at patient's request.  Informed prior to removal that the IUD was probably not the cause of her pain.  If pain persists, will complete evaluation with a Pelvic US and  recommend Orthopedic referral to r/o Left Sciatic nerve pain or Lt hip pain.     Rance MuirJeng, it was a pleasure to meet you today!  I'll see you soon for your Annual/Gyn exam.

## 2016-11-24 ENCOUNTER — Encounter: Payer: Self-pay | Admitting: Obstetrics & Gynecology

## 2016-11-24 ENCOUNTER — Ambulatory Visit (INDEPENDENT_AMBULATORY_CARE_PROVIDER_SITE_OTHER): Payer: 59 | Admitting: Obstetrics & Gynecology

## 2016-11-24 VITALS — BP 126/80 | Ht 63.0 in | Wt 123.0 lb

## 2016-11-24 DIAGNOSIS — Z01419 Encounter for gynecological examination (general) (routine) without abnormal findings: Secondary | ICD-10-CM

## 2016-11-24 DIAGNOSIS — Z3042 Encounter for surveillance of injectable contraceptive: Secondary | ICD-10-CM

## 2016-11-24 NOTE — Addendum Note (Signed)
Addended by: Kem ParkinsonBARNES, Nirav Sweda on: 11/24/2016 10:55 AM   Modules accepted: Orders

## 2016-11-24 NOTE — Progress Notes (Signed)
Audrey Hinton 1971-03-12 [id number]   History:    46 y.o.  [id number]  Married  RP:  Established patient presenting for annual gyn exam   HPI:  Paragard removed 11/03/2016 and DepoProvera started.  Left pelvic pain radiating to left leg still present.  Will see Ortho for possible Sciatalgia.  Normal vaginal secretions.  Breasts wnl.  Past medical history,surgical history, family history and social history were all reviewed and documented in the EPIC chart.  Gynecologic History No LMP recorded (lmp unknown). Patient has had an injection. Contraception: Depo-Provera injections Last Pap: 2016. Results were: normal Last mammogram: Will schedule now.  Obstetric History OB History  Gravida Para Term Preterm AB Living  5 4     1 4   SAB TAB Ectopic Multiple Live Births  1            # Outcome Date GA Lbr Len/2nd Weight Sex Delivery Anes PTL Lv  5 SAB           4 Para           3 Para           2 Para           1 Para                ROS: A ROS was performed and pertinent positives and negatives are included in the history.  GENERAL: No fevers or chills. HEENT: No change in vision, no earache, sore throat or sinus congestion. NECK: No pain or stiffness. CARDIOVASCULAR: No chest pain or pressure. No palpitations. PULMONARY: No shortness of breath, cough or wheeze. GASTROINTESTINAL: No abdominal pain, nausea, vomiting or diarrhea, melena or bright red blood per rectum. GENITOURINARY: No urinary frequency, urgency, hesitancy or dysuria. MUSCULOSKELETAL: No joint or muscle pain, no back pain, no recent trauma. DERMATOLOGIC: No rash, no itching, no lesions. ENDOCRINE: No polyuria, polydipsia, no heat or cold intolerance. No recent change in weight. HEMATOLOGICAL: No anemia or easy bruising or bleeding. NEUROLOGIC: No headache, seizures, numbness, tingling or weakness. PSYCHIATRIC: No depression, no loss of interest in normal activity or change in sleep pattern.     Exam:   BP 126/80   Ht  5\' 3"  (1.6 m)   Wt 123 lb (55.8 kg)   LMP  (LMP Unknown) Comment: SPOTTI NG  BMI 21.79 kg/m   Body mass index is 21.79 kg/m.  General appearance : Well developed well nourished female. No acute distress HEENT: Eyes: no retinal hemorrhage or exudates,  Neck supple, trachea midline, no carotid bruits, no thyroidmegaly Lungs: Clear to auscultation, no rhonchi or wheezes, or rib retractions  Heart: Regular rate and rhythm, no murmurs or gallops Breast:Examined in sitting and supine position were symmetrical in appearance, no palpable masses or tenderness,  no skin retraction, no nipple inversion, no nipple discharge, no skin discoloration, no axillary or supraclavicular lymphadenopathy Abdomen: no palpable masses or tenderness, no rebound or guarding Extremities: no edema or skin discoloration or tenderness  Pelvic:  Bartholin, Urethra, Skene Glands: Within normal limits             Vagina: No gross lesions or discharge  Cervix: No gross lesions or discharge.  Pap/HPV done.  Uterus  RV, normal size, shape and consistency, non-tender and mobile  Adnexa  Without masses or tenderness  Anus and perineum  normal     Assessment/Plan:  46 y.o. female for annual exam   1. Encounter for routine gynecological examination with Papanicolaou smear  of cervix Normal Gyn exam.  Pap/HPV done.  Will schedule screening Mammo.  2. Encounter for surveillance of injectable contraceptive Continue with DepoProvera injections.  [name] MD, 10:28 AM 11/24/2016

## 2016-11-24 NOTE — Patient Instructions (Signed)
1. Encounter for routine gynecological examination with Papanicolaou smear of cervix Normal Gyn exam.  Pap/HPV done.  Will schedule screening Mammo.  2. Encounter for surveillance of injectable contraceptive Continue with DepoProvera injections.  Good to see you today!  I will inform you of the results as soon as available.

## 2016-11-26 LAB — PAP, TP IMAGING W/ HPV RNA, RFLX HPV TYPE 16,18/45: HPV MRNA, HIGH RISK: NOT DETECTED

## 2016-12-08 ENCOUNTER — Ambulatory Visit (INDEPENDENT_AMBULATORY_CARE_PROVIDER_SITE_OTHER): Payer: 59 | Admitting: Family Medicine

## 2016-12-08 ENCOUNTER — Encounter: Payer: Self-pay | Admitting: Family Medicine

## 2016-12-08 VITALS — BP 98/58 | HR 66 | Temp 98.1°F | Wt 126.4 lb

## 2016-12-08 DIAGNOSIS — M25552 Pain in left hip: Secondary | ICD-10-CM

## 2016-12-08 DIAGNOSIS — M7062 Trochanteric bursitis, left hip: Secondary | ICD-10-CM | POA: Insufficient documentation

## 2016-12-08 DIAGNOSIS — M67952 Unspecified disorder of synovium and tendon, left thigh: Secondary | ICD-10-CM | POA: Diagnosis not present

## 2016-12-08 MED ORDER — NAPROXEN 500 MG PO TABS: 500.0000 mg | ORAL_TABLET | Freq: Two times a day (BID) | ORAL | 0 refills | Status: DC | PRN

## 2016-12-08 MED ORDER — METHYLPREDNISOLONE ACETATE 40 MG/ML IJ SUSP
40.0000 mg | Freq: Once | INTRAMUSCULAR | Status: AC
  Administered 2016-12-08: 40 mg via INTRAMUSCULAR

## 2016-12-08 MED FILL — NAPROXEN 500 MG TABLET: 500 | 15 days supply | Qty: 30 | Fill #0

## 2016-12-08 NOTE — Progress Notes (Signed)
   HPI  CC: Left leg/hip pain Patient is coming in with complaints of left-sided leg/hip pain. She states that she has had this issue for a long time. She used to think that this was caused by her IUD which she has had removed. Since removal she has not had any improvement in this pain. Pain is acutely worse this month and it was previously. She denies any trauma, injury, or falls. Pain is located along the lateral aspect of the left leg. It occasionally can radiate down to her lateral knee. Pain is worse with sitting and when getting into the driver's seat of her car. Pain is sharp and occasionally achy. She denies any weakness, numbness, or paresthesias. No back pain.   Patient exercises approximately once a week with running and walking. She has not taken any medication for this. She does not ice this area. She denies any history of injury to her lower extremities.  Review of Systems See HPI for ROS.   CC, SH/smoking status, and VS noted  Objective: BP (!) 98/58   Pulse 66   Temp 98.1 F (36.7 C) (Oral)   Wt 126 lb 6.4 oz (57.3 kg)   LMP  (LMP Unknown) Comment: SPOTTI NG  SpO2 98%   BMI 22.39 kg/m  Gen: NAD, alert, cooperative. CV: Well-perfused. Resp: Non-labored. Neuro: Sensation intact throughout. Hip, Left: No evidence of swelling, erythema, ecchymosis, or atrophy. ROM mostly intact with limiting forward flexion 2/2 pain. Strength 5/5 throughout however patient was globally weak with hip abduction. Pelvic alignment unremarkable to inspection and palpation. No evidence of trendelenburg or gait unsteadiness. Greater trochanter notably tender to palpation. Continued tenderness posteriorly over piriformis and extending superiorly towards the iliac crest (gluteus medius). No SI joint tenderness and normal minimal SI movement. FABER neg for pain. FADIR elicited reproducible pain at the lateral hip.  Assessment and plan:  Greater trochanteric bursitis of left hip Patient is here with  signs and symptoms consistent with greater trochanteric bursitis. After discussion of possible treatment options patient opted to receive steroid injection as well as naproxen. - Patient tolerated greater trochanteric bursal injection well. She endorsed improved symptoms approximately 5-10 minutes after injection. I informed her that pain may return after anesthetic wears off but would likely get better over the next 5-7 days. - Naproxen 500 mg twice a day 5 days. Then twice a day when necessary after that. - Ice 2 times a day at least. - f/u 4wks PRN  Tendinopathy of left gluteus medius Patient diagnosed with left-sided greater trochanteric bursitis of the left hip today, however patient's pain symptoms extend beyond this diagnosis likely involving patient's gluteus medius and piriformis muscles. Bilateral hip abduction weakness noted on exam. - Naproxen as above - Ice as above - Patient provided hip strengthening exercises to perform at home. I have asked her to do this daily for the next 2 weeks and to continue this semi-regularly after. Patient stated her understanding and willingness to do so.   Meds ordered this encounter  Medications  . naproxen (NAPROSYN) 500 MG tablet    Sig: Take 1 tablet (500 mg total) by mouth 2 (two) times daily as needed.    Dispense:  30 tablet    Refill:  0  . methylPREDNISolone acetate (DEPO-MEDROL) injection 40 mg     Kathee DeltonIan D Kasiyah Platter, MD,MS,  PGY3 12/08/2016 3:41 PM

## 2016-12-08 NOTE — Assessment & Plan Note (Signed)
Patient diagnosed with left-sided greater trochanteric bursitis of the left hip today, however patient's pain symptoms extend beyond this diagnosis likely involving patient's gluteus medius and piriformis muscles. Bilateral hip abduction weakness noted on exam. - Naproxen as above - Ice as above - Patient provided hip strengthening exercises to perform at home. I have asked her to do this daily for the next 2 weeks and to continue this semi-regularly after. Patient stated her understanding and willingness to do so.

## 2016-12-08 NOTE — Assessment & Plan Note (Signed)
Patient is here with signs and symptoms consistent with greater trochanteric bursitis. After discussion of possible treatment options patient opted to receive steroid injection as well as naproxen. - Patient tolerated greater trochanteric bursal injection well. She endorsed improved symptoms approximately 5-10 minutes after injection. I informed her that pain may return after anesthetic wears off but would likely get better over the next 5-7 days. - Naproxen 500 mg twice a day 5 days. Then twice a day when necessary after that. - Ice 2 times a day at least. - f/u 4wks PRN

## 2016-12-08 NOTE — Patient Instructions (Signed)
It was a pleasure seeing you today in our clinic. Today we discussed your left leg/hip pain. This injury and the pain involved may take a long time to heal/resolve, so be patient. Here is the treatment plan I would like for you to follow:  For most musculoskeletal pain we physicians tend to recommend the "R.I.C.E." method of therapy. This stands for "Rest", "Ice", "Compression", and "Elevation". - Rest: I would like for you to take it easy for a period of time. This may be a period of days or weeks, everyone is different. Allow pain to be your guide. Achiness and soreness is to be expected, but true pain is to be avoided. - Ice: Using an ice pack 2-4 times a day for ~20 minutes each time can help with pain, swelling, and healing. - Compression: Using a compression sleeve or ACE Bandage (both available at most pharmacies) can help with swelling and pain. Having these on through the day is ideal, but do not sleep with these in place. - Elevation: It is not always possible to elevate an injury due to it's location on your body. However, whenever possible it would be beneficial to elevate musculoskeletal injuries above the level of the heart. (Example: propping an injured ankle up on pillows at night while asleep)  Naproxen and Tylenol: For most musculoskeletal pain I tell patients to alternate taking naproxen and Tylenol. Begin taking the naproxen 2 times a day (scheduled) for 5 days, then take the Tylenol inbetween each of the tylenol dosings as needed for additional pain (this allows for Tylenol to be taken up to 3 times a day).  Dosing: - 500mg  of naproxen 2 times a day.  - 650-1000mg  of Tylenol 3 times a day (max dose).    Gluteus Medius Syndrome Rehab Ask your health care provider which exercises are safe for you. Do exercises exactly as told by your health care provider and adjust them as directed. It is normal to feel mild stretching, pulling, tightness, or discomfort as you do these exercises,  but you should stop right away if you feel sudden pain or your pain gets worse. Do not begin these exercises until told by your health care provider.  Strengthening exercises These exercises build strength and endurance in your hip and pelvis. Endurance is the ability to use your muscles for a long time, even after they get tired. Exercise B: Bridge (hip extensors) 1. Lie on your back on a firm surface with your knees bent and your feet flat on the floor. 2. Tighten your buttocks muscles and lift your bottom off the floor until the trunk of your body is level with your thighs. ? You should feel the muscles working in your buttocks and the back of your thighs. If this exercise is too easy, cross your arms over your chest or lift one leg while your bottom is up off the floor. ? Do not arch your back. 3. Hold this position for __________ seconds. 4. Slowly lower your hips to the starting position. 5. Let your muscles relax completely between repetitions. Repeat __________ times. Complete this exercise __________ times a day. Exercise C: Straight leg raises ( hip abductors) 1. Lie on your side with your left / right leg in the top position. Lie so your head, shoulder, knee, and hip line up. Bend your bottom knee to help you balance. 2. Lift your top leg up 4-6 inches (10-15 cm), keeping your toes pointed straight ahead. 3. Hold this position for __________ seconds.  4. Slowly lower your leg to the starting position and let your muscles relax completely. Repeat __________ times. Complete this exercise __________ times a day. Exercise D: Hip abductors and external rotators, quadruped 1. Get on your hands and knees on a firm, lightly padded surface. Your hands should be directly below your shoulders, and your knees should be directly below your hips. 2. Lift your left / right knee out to the side. Keep your knee bent. Do not twist your body. 3. Hold this position for __________ seconds. 4. Slowly  lower your leg. Repeat __________ times. Complete this exercise __________ times a day. Exercise E: Single leg stand 1. Stand near a counter or door frame to hold onto as needed. It is helpful to look in a mirror for this exercise so you can watch your hip. 2. Squeeze your left / right buttock muscles then lift up your other foot. Do not let your left / righthip push out to the side. 3. Hold this position for __________ seconds. Repeat __________ times. Complete this exercise __________ times a day. This information is not intended to replace advice given to you by your health care provider. Make sure you discuss any questions you have with your health care provider. Document Released: 06/09/2005 Document Revised: 02/14/2016 Document Reviewed: 05/22/2015 Elsevier Interactive Patient Education  2018 [employer]  Bursitis Bursitis is when the fluid-filled sac (bursa) that covers and protects a joint is swollen (inflamed). Bursitis is most common near joints, especially the knees, elbows, hips, and shoulders. Follow these instructions at home:  Take medicines only as told by your doctor.  If you were prescribed an antibiotic medicine, finish it all even if you start to feel better.  Rest the affected area as told by your doctor. ? Keep the area raised up. ? Avoid doing things that make the pain worse.  Apply ice to the injured area: ? Place ice in a plastic bag. ? Place a towel between your skin and the bag. ? Leave the ice on for 20 minutes, 2-3 times a day.  Use splints, braces, pads, or walking aids as told by your doctor.  Keep all follow-up visits as told by your doctor. This is important. Contact a doctor if:  You have more pain with home care.  You have a fever.  You have chills. This information is not intended to replace advice given to you by your health care provider. Make sure you discuss any questions you have with your health care provider. Document Released:  11/27/2009 Document Revised: 11/15/2015 Document Reviewed: 08/29/2013 Elsevier Interactive Patient Education  2018 [employer]

## 2017-01-30 ENCOUNTER — Ambulatory Visit (INDEPENDENT_AMBULATORY_CARE_PROVIDER_SITE_OTHER): Payer: 59 | Admitting: Anesthesiology

## 2017-01-30 DIAGNOSIS — Z3042 Encounter for surveillance of injectable contraceptive: Secondary | ICD-10-CM | POA: Diagnosis not present

## 2017-01-30 MED ORDER — MEDROXYPROGESTERONE ACETATE 150 MG/ML IM SUSP
150.0000 mg | Freq: Once | INTRAMUSCULAR | Status: AC
  Administered 2017-01-30: 150 mg via INTRAMUSCULAR

## 2017-01-30 MED FILL — medroxyPROGESTERone ACETATE: 150 | 90 days supply | Qty: 1 | Fill #0

## 2017-04-08 ENCOUNTER — Ambulatory Visit (INDEPENDENT_AMBULATORY_CARE_PROVIDER_SITE_OTHER): Payer: 59 | Admitting: Family Medicine

## 2017-04-08 ENCOUNTER — Encounter: Payer: Self-pay | Admitting: Family Medicine

## 2017-04-08 VITALS — BP 106/68 | HR 76 | Temp 98.3°F | Ht 63.0 in | Wt 131.2 lb

## 2017-04-08 DIAGNOSIS — S46819A Strain of other muscles, fascia and tendons at shoulder and upper arm level, unspecified arm, initial encounter: Secondary | ICD-10-CM | POA: Diagnosis not present

## 2017-04-08 MED ORDER — CYCLOBENZAPRINE HCL 10 MG PO TABS: 10.0000 mg | ORAL_TABLET | Freq: Three times a day (TID) | ORAL | 0 refills | Status: DC | PRN

## 2017-04-08 MED FILL — CYCLOBENZAPRINE 10 MG TAB: 10 | 10 days supply | Qty: 30 | Fill #0

## 2017-04-08 NOTE — Assessment & Plan Note (Addendum)
Acute. Bilateral. Suspect this is likely related to a combination of overuse with occupation and increased stress level with recent death of father 5 months ago. Patient is without weakness. --Given prescription for Flexeril 10 mg 3 times daily as needed for muscle spasms with instructions to take along with Tylenol and use heating pad --RTC in 2 weeks if symptoms do not improve

## 2017-04-08 NOTE — Progress Notes (Deleted)
   Subjective:   Patient ID: Audrey Hinton    DOB: 1970-11-28, 46 y.o. female   MRN: [id number]  CC: "***"  HPI: Audrey Hinton is a 46 y.o. female who presents for a same day appointment for the following:  EXTREMITY PAIN  Location: *** Pain started: *** Pain is: *** Severity: *** Medications tried: *** Recent trauma: *** Similar pain previously: ***  Symptoms Redness:*** Swelling:*** Fever: *** Weakness: *** Weight loss: *** Rash: ***  Complete ROS performed, see HPI for pertinent.  PMFSH: Carpal tunnel syndrome, allergic rhinitis, GERD, IDA. Surgical history bladder. Family history HTN. Smoking status reviewed. Medications reviewed.  Objective:   There were no vitals taken for this visit. Vitals and nursing note reviewed.  General: well nourished, well developed, in no acute distress with non-toxic appearance HEENT: normocephalic, atraumatic, moist mucous membranes Neck: supple, non-tender without lymphadenopathy CV: regular rate and rhythm without murmurs, rubs, or gallops, no lower extremity edema Lungs: clear to auscultation bilaterally with normal work of breathing Abdomen: soft, non-tender, non-distended, no masses or organomegaly palpable, normoactive bowel sounds Skin: warm, dry, no rashes or lesions, cap refill < 2 seconds Extremities: warm and well perfused, normal tone  Assessment & Plan:   No problem-specific Assessment & Plan notes found for this encounter.  No orders of the defined types were placed in this encounter.  No orders of the defined types were placed in this encounter.   Durward Parcelavid McMullen, DO [facility], PGY-2 04/08/2017 8:09 AM

## 2017-04-08 NOTE — Patient Instructions (Signed)
Thank you for coming in to see us today. Please see below to review our plan for today's visit.  Use the muscle relaxer called Flexeril up to 3 times daily as needed for muscle strain. Use Tylenol with this medication as needed. I do believe stress plays a part in your symptoms and I would find ways to help counter your stress level. I would also advise you to avoid having to lift your arms too often while at work.   Please call the clinic at [phone] if your symptoms worsen or you have any concerns. It was my pleasure to see you. -- Durward Parcelavid Antinette Keough, DO [facility], PGY-2

## 2017-04-08 NOTE — Progress Notes (Signed)
   Subjective:   Patient ID: Audrey Hinton    DOB: 1971-06-02, 46 y.o. female   MRN: [id number]  CC: "Shoulder pains"  HPI: Audrey Hinton is a 46 y.o. female who presents for a same day appointment for the following:  EXTREMITY PAIN  Location: upper back and upper extremities Pain started: 2 weeks ago Pain is: tight Severity: 7/10 Medications tried: tylenol Recent trauma: no Similar pain previously: yes  Symptoms Redness: no Swelling: no Fever: no Weakness: yes Weight loss: no Rash: no  Complete ROS performed, see HPI for pertinent.  PMFSH: Carpal tunnel syndrome, allergic rhinitis, GERD, IDA. Surgical history bladder. Family history HTN. Smoking status reviewed. Medications reviewed.  Objective:   BP 106/68   Pulse 76   Temp 98.3 F (36.8 C) (Oral)   Ht 5\' 3"  (1.6 m)   Wt 131 lb 3.2 oz (59.5 kg)   SpO2 100%   BMI 23.24 kg/m  Vitals and nursing note reviewed.  General: well nourished, well developed, in no acute distress with non-toxic appearance Neck: supple, non-tender without lymphadenopathy CV: regular rate and rhythm without murmurs, rubs, or gallops, no lower extremity edema Lungs: clear to auscultation bilaterally with normal work of breathing Skin: warm, dry, no rashes or lesions, cap refill < 2 seconds Extremities: warm and well perfused, normal tone, 5/5 motor strength throughout, mild tenderness to trapezius muscle with minimal tension to palpation bilaterally  Assessment & Plan:   Trapezius muscle strain Acute. Bilateral. Suspect this is likely related to a combination of overuse with occupation and increased stress level with recent death of father 5 months ago. Patient is without weakness. --Given prescription for Flexeril 10 mg 3 times daily as needed for muscle spasms with instructions to take along with Tylenol and use heating pad --RTC in 2 weeks if symptoms do not improve  No orders of the defined types were placed in this encounter.  Meds  ordered this encounter  Medications  . cyclobenzaprine (FLEXERIL) 10 MG tablet    Sig: Take 1 tablet (10 mg total) by mouth 3 (three) times daily as needed for muscle spasms.    Dispense:  30 tablet    Refill:  0    Durward Parcelavid Tarisa Paola, DO [facility], PGY-2 04/08/2017 1:31 PM

## 2017-04-15 MED FILL — MEDROXYPROG 150 MG/ML SYR: 150 | 90 days supply | Qty: 1 | Fill #1

## 2017-04-30 ENCOUNTER — Ambulatory Visit (INDEPENDENT_AMBULATORY_CARE_PROVIDER_SITE_OTHER): Payer: 59 | Admitting: Anesthesiology

## 2017-04-30 DIAGNOSIS — Z3042 Encounter for surveillance of injectable contraceptive: Secondary | ICD-10-CM | POA: Diagnosis not present

## 2017-04-30 MED ORDER — MEDROXYPROGESTERONE ACETATE 150 MG/ML IM SUSP
150.0000 mg | Freq: Once | INTRAMUSCULAR | Status: AC
  Administered 2017-04-30: 150 mg via INTRAMUSCULAR

## 2017-05-22 ENCOUNTER — Ambulatory Visit: Payer: 59 | Admitting: Obstetrics & Gynecology

## 2018-08-21 ENCOUNTER — Ambulatory Visit (INDEPENDENT_AMBULATORY_CARE_PROVIDER_SITE_OTHER): Payer: Self-pay | Admitting: Nurse Practitioner

## 2018-08-21 VITALS — BP 102/74 | HR 64 | Temp 98.4°F | Resp 12 | Wt 132.2 lb

## 2018-08-21 DIAGNOSIS — H8109 Meniere's disease, unspecified ear: Secondary | ICD-10-CM

## 2018-08-21 MED ORDER — MECLIZINE HCL 25 MG PO TABS: 25.0000 mg | ORAL_TABLET | Freq: Three times a day (TID) | ORAL | 0 refills | Status: DC | PRN

## 2018-08-21 MED FILL — MECLIZINE 25 MG TABLET: 25 | 10 days supply | Qty: 30 | Fill #0

## 2018-08-21 NOTE — Progress Notes (Signed)
Subjective:     Audrey Hinton is a 48 y.o. female who presents for evaluation of dizziness, headache and unsteady gait. The symptoms started 2 days ago and are ongoing. The attacks occur frequently and last several minutes. Positions that worsen symptoms: none. Previous workup/treatments: none. Associated ear symptoms: aural pressure otalgia hearing loss. Associated CNS symptoms: none. Recent infections: none. Head trauma: denied. Drug ingestion: none. Noise exposure: no occupational exposure. Family history: non-contributory.  The patient informs she was at work on yesterday and suddenly became dizzy to the point she had to leave early.  Patient states, "I felt bad, and it was not good".  Patient also complains of nausea and vomiting.  Patient denies abdominal pain, nausea or vomiting at this time.  Patient also informs that she has been depressed since her mother passed away several months ago.    The following portions of the patient's history were reviewed and updated as appropriate: allergies, current medications and past medical history.  Review of Systems Constitutional: positive for anorexia and fatigue, negative for chills, fevers, malaise and sweats Eyes: negative Ears, nose, mouth, throat, and face: positive for change in hearing, bilateral ear pressure/fullness, negative for ear drainage, earaches, nasal congestion and sore throat Respiratory: negative Cardiovascular: negative Gastrointestinal: positive for nausea and vomiting, negative for abdominal pain, diarrhea and melena Neurological: positive for dizziness, negative for coordination problems, gait problems, headaches, paresthesia, seizures and weakness    Objective:    BP 102/74   Pulse 64   Temp 98.4 F (36.9 C)   Resp 12   Wt 132 lb 3.2 oz (60 kg)   SpO2 99%   BMI 23.42 kg/m     Physical Exam Constitutional:      General: She is not in acute distress. HENT:     Head: Normocephalic.     Right Ear: Tympanic membrane,  ear canal and external ear normal.     Left Ear: Tympanic membrane, ear canal and external ear normal.     Nose: Nose normal.     Mouth/Throat:     Mouth: Mucous membranes are moist.  Eyes:     Pupils: Pupils are equal, round, and reactive to light.  Neck:     Musculoskeletal: Normal range of motion and neck supple.  Cardiovascular:     Rate and Rhythm: Normal rate and regular rhythm.     Pulses: Normal pulses.     Heart sounds: Normal heart sounds.  Pulmonary:     Effort: Pulmonary effort is normal. No respiratory distress.     Breath sounds: Normal breath sounds. No stridor. No wheezing, rhonchi or rales.  Abdominal:     General: Bowel sounds are normal.     Palpations: Abdomen is soft.     Tenderness: There is no abdominal tenderness.  Lymphadenopathy:     Cervical: No cervical adenopathy.  Skin:    General: Skin is warm and dry.     Capillary Refill: Capillary refill takes less than 2 seconds.  Neurological:     General: No focal deficit present.     Mental Status: She is alert and oriented to person, place, and time.     GCS: GCS eye subscore is 4. GCS verbal subscore is 5. GCS motor subscore is 6.     Cranial Nerves: Cranial nerves are intact.     Motor: No weakness, tremor, seizure activity or pronator drift.     Coordination: Coordination normal. Heel to Shin Test normal.     Gait:  Gait is intact. Gait and tandem walk normal.    Assessment:  Meniere's Disease   Plan:   Exam findings, diagnosis etiology and medication use and indications reviewed with patient. Follow- Up and discharge instructions provided. No emergent/urgent issues found on exam. Meclizine was provided per medication orders.  Discussed with patient the nature of her symptoms along with the usual course of treatment.  Patient did not display any red flags of lower extremity weakness, no focal deficits, motor weakness or cranial nerve deficits.  Patient's vitals were stable and patient was in no acute  distress.  Patient was instructed to follow up with her PCP within 48 hours.   Patient education was provided. Patient verbalized understanding of information provided and agrees with plan of care (POC), all questions answered. The patient is advised to call or return to clinic if condition does not see an improvement in symptoms, or to seek the care of the closest emergency department if condition worsens with the above plan.   1. Meniere's disease, unspecified laterality  - meclizine (ANTIVERT) 25 MG tablet; Take 1 tablet (25 mg total) by mouth 3 (three) times daily as needed for dizziness.  Dispense: 30 tablet; Refill: 0 -Take medication as prescribed. -Go home and rest. -Lie down on a flat surface and focus your eyes on one object that does not move. Try to stay in that position until your symptoms go away. -I would like you to follow-up with your regular doctor in the next 48 hours.  There is some concern that you may need blood work or further testing at that time.

## 2018-08-21 NOTE — Patient Instructions (Addendum)
Meniere Disease  -Take medication as prescribed. -Go home and rest. -Lie down on a flat surface and focus your eyes on one object that does not move. Try to stay in that position until your symptoms go away. -I would like you to follow-up with your regular doctor in the next 48 hours.  There is some concern that you may need blood work or further testing at that time.  Meniere disease is an inner ear disorder. It causes attacks of a spinning sensation (vertigo), dizziness, and ringing in the ear (tinnitus). It also causes hearing loss and a feeling of fullness or pressure in the ear. This is a lifelong condition, and it may get worse over time. You may have drop attacks or severe dizziness that makes you fall. A drop attack is when you suddenly fall without losing consciousness and you quickly recover after a few seconds or minutes. What are the causes? This condition is caused by having too much of the fluid that is in your inner ear (endolymph). When fluid builds up in your inner ear, it affects the nerves that control balance and hearing. The reason for the fluid buildup is not known. Possible causes include:  Allergies.  An abnormal reaction of the body's defense system (autoimmune disease).  Viral infection of the inner ear.  Head injury. What increases the risk? You are more likely to develop this condition if:  You are older than age 33.  You have a family history of Meniere disease.  You have a history of autoimmune disease.  You have a history of migraine headaches. What are the signs or symptoms? Symptoms of this condition can come and go and may last for up to 4 hours at a time. Symptoms usually start in one ear. They may become more frequent and eventually involve both ears. Symptoms can include:  Fullness and pressure in your ear.  Roaring or ringing in your ear.  Vertigo and loss of balance.  Dizziness.  Decreased hearing.  Nausea and vomiting. How is this  diagnosed? This condition is diagnosed based on:  A physical exam.  Tests , such as: ? A hearing test (audiogram). ? An electronystagmogram. This tests your balance nerve (vestibular nerve). ? Imaging studies of your inner ear, such as CT scan or MRI. ? Other balance tests, such as rotational or balance platform tests. How is this treated? There is no cure for this condition, but treatment can help to manage your symptoms. Treatment may include:  A low-salt diet. Limiting salt may help to reduce fluid in the body and relieve symptoms.  Oral or injected medicines to reduce or control: ? Vertigo. ? Nausea. ? Fluid retention. ? Dizziness.  Use of an air pressure pulse generator. This is a machine that sends small pressure pulses into your ear canal.  Hearing aids.  Inner ear surgery. This is rare. When you have symptoms, it can be helpful to lie down on a flat surface and focus your eyes on one object that does not move. Try to stay in that position until your symptoms go away. Follow these instructions at home: Eating and drinking  Eat the same amount of food at the same time every day, including snacks.  Do not skip meals.  Avoid caffeine.  Drink enough fluids to keep your urine clear or pale yellow.  Limit alcoholic drinks to one drink a day for non-pregnant women and 2 drinks a day for men. One drink equals 12 oz of beer, 5  oz of wine, or 1 oz of hard liquor.  Limit the salt (sodium) in your diet as told by your health care provider. Check ingredients and nutrition facts on packaged foods and beverages.  Do not eat foods that contain monosodium glutamate (MSG). General instructions  Do not use any products that contain nicotine or tobacco, such as cigarettes and e-cigarettes. If you need help quitting, ask your health care provider.  Take over-the-counter and prescription medicines only as told by your health care provider.  Find ways to reduce or avoid stress. If  you need help with this, ask your health care provider.  Do not drive if you have vertigo or dizziness. Contact a health care provider if:  You have symptoms that last longer than 4 hours.  You have new or worse symptoms. Get help right away if:  You have been vomiting for 24 hours.  You cannot keep fluids down.  You have chest pain or trouble breathing. Summary  Meniere disease is an inner ear disorder. It causes attacks of a spinning sensation (vertigo), dizziness, and ringing in the ear (tinnitus). It also causes hearing loss and a feeling of fullness or pressure in the ear.  Symptoms of this condition can come and go and may last for up to 4 hours at a time.  When you have symptoms, it can be helpful to lie down on a flat surface and focus your eyes on one object that does not move. Try to stay in that position until your symptoms go away. This information is not intended to replace advice given to you by your health care provider. Make sure you discuss any questions you have with your health care provider. Document Released: 06/06/2000 Document Revised: 04/30/2016 Document Reviewed: 04/30/2016 Elsevier Interactive Patient Education  2019 [employer].

## 2018-08-23 ENCOUNTER — Telehealth: Payer: Self-pay

## 2018-08-23 NOTE — Telephone Encounter (Signed)
Patient did not answered the phone, I left a message asking to call us back.  

## 2018-10-04 ENCOUNTER — Ambulatory Visit: Payer: 59 | Admitting: Family Medicine

## 2019-12-14 ENCOUNTER — Other Ambulatory Visit: Payer: Self-pay

## 2019-12-14 ENCOUNTER — Encounter: Payer: Self-pay | Admitting: Family Medicine

## 2019-12-14 ENCOUNTER — Ambulatory Visit (INDEPENDENT_AMBULATORY_CARE_PROVIDER_SITE_OTHER): Payer: 59 | Admitting: Family Medicine

## 2019-12-14 VITALS — BP 120/75 | HR 76 | Ht 62.6 in | Wt 134.2 lb

## 2019-12-14 DIAGNOSIS — H818X9 Other disorders of vestibular function, unspecified ear: Secondary | ICD-10-CM | POA: Insufficient documentation

## 2019-12-14 DIAGNOSIS — H8109 Meniere's disease, unspecified ear: Secondary | ICD-10-CM

## 2019-12-14 DIAGNOSIS — R42 Dizziness and giddiness: Secondary | ICD-10-CM

## 2019-12-14 MED ORDER — MECLIZINE HCL 25 MG PO TABS: 25.0000 mg | ORAL_TABLET | Freq: Three times a day (TID) | ORAL | 0 refills | Status: DC | PRN

## 2019-12-14 MED ORDER — ESOMEPRAZOLE MAGNESIUM 20 MG PO CPDR: 20.0000 mg | DELAYED_RELEASE_CAPSULE | Freq: Every day | ORAL | 2 refills | Status: DC

## 2019-12-14 MED FILL — MECLIZINE 25 MG TABLET: 25 | 10 days supply | Qty: 30 | Fill #0

## 2019-12-14 MED FILL — ESOMEPRAZOLE MAG DR 20 MG C: 20 | 30 days supply | Qty: 30 | Fill #0

## 2019-12-14 NOTE — Patient Instructions (Signed)
It was great to meet you today! Thank you for letting me participate in your care!  Today, we discussed your symptoms of dizziness which I believe is due to otolith dysfunction. I have refilled Meclizine to help with symptoms of dizziness and nausea but treatment is important so I encourage you to go to vestibular rehab.  If any of your labs are abnormal I will call you.   Dizziness Dizziness is a common problem. It makes you feel unsteady or light-headed. You may feel like you are about to pass out (faint). Dizziness can lead to getting hurt if you stumble or fall. Dizziness can be caused by many things, including:  Medicines.  Not having enough water in your body (dehydration).  Illness. Follow these instructions at home: Eating and drinking   Drink enough fluid to keep your pee (urine) clear or pale yellow. This helps to keep you from getting dehydrated. Try to drink more clear fluids, such as water.  Do not drink alcohol.  Limit how much caffeine you drink or eat, if your doctor tells you to do that.  Limit how much salt (sodium) you drink or eat, if your doctor tells you to do that. Activity   Avoid making quick movements. ? When you stand up from sitting in a chair, steady yourself until you feel okay. ? In the morning, first sit up on the side of the bed. When you feel okay, stand slowly while you hold onto something. Do this until you know that your balance is fine.  If you need to stand in one place for a long time, move your legs often. Tighten and relax the muscles in your legs while you are standing.  Do not drive or use heavy machinery if you feel dizzy.  Avoid bending down if you feel dizzy. Place items in your home so you can reach them easily without leaning over. Lifestyle  Do not use any products that contain nicotine or tobacco, such as cigarettes and e-cigarettes. If you need help quitting, ask your doctor.  Try to lower your stress level. You can do this  by using methods such as yoga or meditation. Talk with your doctor if you need help. General instructions  Watch your dizziness for any changes.  Take over-the-counter and prescription medicines only as told by your doctor. Talk with your doctor if you think that you are dizzy because of a medicine that you are taking.  Tell a friend or a family member that you are feeling dizzy. If he or she notices any changes in your behavior, have this person call your doctor.  Keep all follow-up visits as told by your doctor. This is important. Contact a doctor if:  Your dizziness does not go away.  Your dizziness or light-headedness gets worse.  You feel sick to your stomach (nauseous).  You have trouble hearing.  You have new symptoms.  You are unsteady on your feet.  You feel like the room is spinning. Get help right away if:  You throw up (vomit) or have watery poop (diarrhea), and you cannot eat or drink anything.  You have trouble: ? Talking. ? Walking. ? Swallowing. ? Using your arms, hands, or legs.  You feel generally weak.  You are not thinking clearly, or you have trouble forming sentences. A friend or family member may notice this.  You have: ? Chest pain. ? Pain in your belly (abdomen). ? Shortness of breath. ? Sweating.  Your vision changes.  You  are bleeding.  You have a very bad headache.  You have neck pain or a stiff neck.  You have a fever. These symptoms may be an emergency. Do not wait to see if the symptoms will go away. Get medical help right away. Call your local emergency services (911 in the [place] Do not drive yourself to the hospital. Summary  Dizziness makes you feel unsteady or light-headed. You may feel like you are about to pass out (faint).  Drink enough fluid to keep your pee (urine) clear or pale yellow. Do not drink alcohol.  Avoid making quick movements if you feel dizzy.  Watch your dizziness for any changes. This information  is not intended to replace advice given to you by your health care provider. Make sure you discuss any questions you have with your health care provider. Document Revised: 06/12/2017 Document Reviewed: 06/26/2016 Elsevier Patient Education  Lena well, Harolyn Rutherford, DO PGY-3, [facility]

## 2019-12-14 NOTE — Progress Notes (Signed)
    SUBJECTIVE:   CHIEF COMPLAINT / HPI:   Dizziness Patient states she had a similar episode about one year ago. This most recent episode started last Friday after she got home after work and came on suddenly. She states every thing was fine and then she stood up and describes a sensation of her head feeling heavy and like she was going to fall and couldn't keep her balance. She denies the sensation of the room spinning around or moving. She has some associated nausea with this episode but no vomiting. She denies any tinnitus or loss of hearing and no ear pain or discharge. She did not fall and her symptoms improved with sitting down, relaxing, and resting. She is still having the sensation some that is made worse with movement and walking and better with sitting and rest. She had no exacerbating circumstances and feels like it happened randomly. Patient is concerned about her blood work since she had anemia in the past.   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 - Refilled Meclizine - Referral to vestibular rehab - obtaining CBC to rule out anemia as a contributing cause - obtaining CMP to rule out electrolyte abnormalities and hypoglycemia as potential contributors to her symptoms     Arlyce Harman, DO [facility]

## 2019-12-14 NOTE — Assessment & Plan Note (Addendum)
Differential remains broad but I suspect otolith disease as I was able to reproduce her symptoms with rapid side to side head movement. - Refilled Meclizine - Referral to vestibular rehab - obtaining CBC to rule out anemia as a contributing cause - obtaining CMP to rule out electrolyte abnormalities and hypoglycemia as potential contributors to her symptoms

## 2019-12-15 ENCOUNTER — Encounter: Payer: Self-pay | Admitting: Family Medicine

## 2019-12-15 ENCOUNTER — Other Ambulatory Visit: Payer: Self-pay | Admitting: Family Medicine

## 2019-12-15 DIAGNOSIS — R718 Other abnormality of red blood cells: Secondary | ICD-10-CM

## 2019-12-15 LAB — COMPREHENSIVE METABOLIC PANEL
ALT: 18 IU/L (ref 0–32)
AST: 22 IU/L (ref 0–40)
Albumin/Globulin Ratio: 1.6 (ref 1.2–2.2)
Albumin: 4.7 g/dL (ref 3.8–4.9)
Alkaline Phosphatase: 77 IU/L (ref 48–121)
BUN/Creatinine Ratio: 16 (ref 9–23)
BUN: 14 mg/dL (ref 6–24)
Bilirubin Total: 0.5 mg/dL (ref 0.0–1.2)
CO2: 23 mmol/L (ref 20–29)
Calcium: 9.4 mg/dL (ref 8.7–10.2)
Chloride: 104 mmol/L (ref 96–106)
Creatinine, Ser: 0.9 mg/dL (ref 0.57–1.00)
GFR calc Af Amer: 86 mL/min/{1.73_m2} (ref >59)
GFR calc non Af Amer: 74 mL/min/{1.73_m2} (ref >59)
Globulin, Total: 3 g/dL (ref 1.5–4.5)
Glucose: 75 mg/dL (ref 65–99)
Potassium: 4.2 mmol/L (ref 3.5–5.2)
Sodium: 139 mmol/L (ref 134–144)
Total Protein: 7.7 g/dL (ref 6.0–8.5)

## 2019-12-15 LAB — CBC
Hematocrit: 36.9 % (ref 34.0–46.6)
Hemoglobin: 11.2 g/dL (ref 11.1–15.9)
MCH: 20.7 pg — ABNORMAL LOW (ref 26.6–33.0)
MCHC: 30.4 g/dL — ABNORMAL LOW (ref 31.5–35.7)
MCV: 68 fL — ABNORMAL LOW (ref 79–97)
Platelets: 282 10*3/uL (ref 150–450)
RBC: 5.42 x10E6/uL — ABNORMAL HIGH (ref 3.77–5.28)
RDW: 19.4 % — ABNORMAL HIGH (ref 11.7–15.4)
WBC: 3.5 10*3/uL (ref 3.4–10.8)

## 2019-12-15 NOTE — Progress Notes (Signed)
MCV is below 70 but no anemia. Called patient and let her know I think it is worth rechecking iron levels and LVM. Will put in future orders and patient can come in for lab visit.

## 2019-12-15 NOTE — Progress Notes (Signed)
Normal labs, letter sent to patient

## 2021-04-22 NOTE — Progress Notes (Signed)
   SUBJECTIVE:   Chief compliant/HPI: annual examination  Audrey Hinton is a 50 y.o. who presents today for an annual exam.   Exercises various amounts each week. Currently not active but used to walk for 67mn/day in the summertime.  Her husband suffered a massive stroke about 3 years ago and so she takes care of him and her kids at home. Feels safe at home. Not sexually active.   [facility]   History tabs reviewed and updated.   Review of systems form reviewed and unremarkable.   OBJECTIVE:  BP 124/70   Pulse (!) 58   Ht 5' 2.6" (1.59 m)   Wt 135 lb 3.2 oz (61.3 kg)   LMP 03/13/2021 (Approximate)   SpO2 100%   BMI 24.26 kg/m   General: NAD, pleasant, able to participate in exam Cardiac: RRR, no murmurs. Respiratory: CTAB, normal effort Abdomen: Bowel sounds present, nontender, nondistended, no hepatosplenomegaly. Psych: Normal affect and mood   ASSESSMENT/PLAN:  GERD Stable per patient. Takes Nexium 278mprn. Refilled today.  Well adult exam Last pap 2018: negative malignancy and HPV not detected. Due 2023. Ordered colonoscopy and mammogram as she has never been screening. Lipid panel and HgbA1c ordered as well. Vaccines UTD. Advanced directive booklet given.   Annual Examination  See AVS for age appropriate recommendations. PHQ score unremarkable, reviewed and discussed.  BP reviewed and at goal.  Asked about intimate partner violence and resources given as appropriate  Advance directives discussion, booklet given.  Considered the following items based upon USPSTF recommendations: Diabetes screening: ordered Screening for elevated cholesterol: ordered HIV testing: discussed, not ordered Hepatitis C: discussed, not ordered Hepatitis B: discussed, not ordered Syphilis if at high risk: discussed GC/CT not at high risk and not ordered. Osteoporosis screening considered based upon [facility]. Major  osteoporotic fracture risk is 4.5%. DEXA not ordered.  Dental health: sees dentist twice yearly Vision: she is considering getting reading glasses in the future. Reviewed risk factors for latent tuberculosis and not indicated  Discussed family history, BRCA testing not indicated. Cervical cancer screening: prior Pap reviewed, repeat due in 2 years Breast cancer screening: discussed, mammogram ordered Colorectal cancer screening: discussed, colonoscopy ordered Lung cancer screening: discussed. See documentation below regarding indications/risks/benefits. Pt does not smoke. Vaccinations up to date.   Follow up in 1 year or sooner if indicated.   DaWells GuilesDO 04/23/2021, 10:41 AM PGY-1, [place]

## 2021-04-23 ENCOUNTER — Ambulatory Visit (INDEPENDENT_AMBULATORY_CARE_PROVIDER_SITE_OTHER): Payer: 59 | Admitting: Student

## 2021-04-23 ENCOUNTER — Other Ambulatory Visit: Payer: Self-pay

## 2021-04-23 ENCOUNTER — Other Ambulatory Visit (HOSPITAL_COMMUNITY): Payer: Self-pay

## 2021-04-23 ENCOUNTER — Encounter: Payer: Self-pay | Admitting: Student

## 2021-04-23 VITALS — BP 124/70 | HR 58 | Ht 62.6 in | Wt 135.2 lb

## 2021-04-23 DIAGNOSIS — Z13220 Encounter for screening for lipoid disorders: Secondary | ICD-10-CM | POA: Diagnosis not present

## 2021-04-23 DIAGNOSIS — K219 Gastro-esophageal reflux disease without esophagitis: Secondary | ICD-10-CM

## 2021-04-23 DIAGNOSIS — Z0000 Encounter for general adult medical examination without abnormal findings: Secondary | ICD-10-CM

## 2021-04-23 DIAGNOSIS — Z131 Encounter for screening for diabetes mellitus: Secondary | ICD-10-CM

## 2021-04-23 DIAGNOSIS — Z1211 Encounter for screening for malignant neoplasm of colon: Secondary | ICD-10-CM

## 2021-04-23 DIAGNOSIS — Z1231 Encounter for screening mammogram for malignant neoplasm of breast: Secondary | ICD-10-CM | POA: Diagnosis not present

## 2021-04-23 LAB — POCT GLYCOSYLATED HEMOGLOBIN (HGB A1C): Hemoglobin A1C: 4.7 % (ref 4.0–5.6)

## 2021-04-23 MED ORDER — ESOMEPRAZOLE MAGNESIUM 20 MG PO CPDR
20.0000 mg | DELAYED_RELEASE_CAPSULE | Freq: Every day | ORAL | 2 refills | Status: DC
  Filled 2021-04-23: qty 30, 30d supply, fill #0

## 2021-04-23 NOTE — Patient Instructions (Signed)
It was great to see you today! Thank you for choosing [facility] for your primary care. Audrey Hinton was seen for annual physical.  Our plans for today were:  - We discussed your medical history and medications and refilled your Nexium for reflux.  - We discussed general recommendations regarding USPSTF guidelines. You are not up to date for your mammogram and colonoscopy so I have sent in referrals who should be contacting you regarding setting those up. If you have not received a call in 1-2 weeks, please let us know. - I have given you an advanced directives booklet to consider your future needs should you be hospitalized or incapable of making decisions regarding your care. Please return this if/when you fill it out so we can have it on file for you. - I recommend 150 minutes of exercise per week-try 30 minutes 5 days per week We discussed reducing sugary beverages (like soda and juice) and increasing leafy greens and whole fruits.   Your blood pressure is 124/70 which is at goal.  We are checking some labs today (lipid and HgbA1c) to screen for high cholesterol and diabetes. If they are abnormal, I will call you. If they are normal, I will send you a MyChart message or a letter. If you do not hear about your labs in the next 2 weeks, please let us know.  You should return to our clinic in 1 year for physical.   I recommend that you always bring your medications to each appointment as this makes it easy to ensure you are on the correct medications and helps [name] not miss refills when you need them.  Please arrive 15 minutes before your appointment to ensure smooth check in process.  We appreciate your efforts in making this happen.  Take care and seek immediate care sooner if you develop any concerns.   Thank you for allowing me to participate in your care, Audrey Mattocks, DO 04/23/2021, 9:20 AM PGY-1, [facility]

## 2021-04-23 NOTE — Assessment & Plan Note (Signed)
Stable per patient. Takes Nexium 20mg  prn. Refilled today.

## 2021-04-23 NOTE — Assessment & Plan Note (Addendum)
Last pap 2018: negative malignancy and HPV not detected. Due 2023. Ordered colonoscopy and mammogram as she has never been screening. Lipid panel and HgbA1c ordered as well. Vaccines UTD. Advanced directive booklet given.

## 2021-04-24 LAB — LIPID PANEL
Chol/HDL Ratio: 2.7 ratio (ref 0.0–4.4)
Cholesterol, Total: 142 mg/dL (ref 100–199)
HDL: 52 mg/dL (ref >39)
LDL Chol Calc (NIH): 76 mg/dL (ref 0–99)
Triglycerides: 69 mg/dL (ref 0–149)
VLDL Cholesterol Cal: 14 mg/dL (ref 5–40)

## 2021-04-27 ENCOUNTER — Encounter: Payer: Self-pay | Admitting: Student

## 2021-05-08 ENCOUNTER — Other Ambulatory Visit: Payer: Self-pay

## 2021-05-08 ENCOUNTER — Ambulatory Visit: Payer: 59 | Admitting: Family Medicine

## 2021-05-08 ENCOUNTER — Encounter: Payer: Self-pay | Admitting: Family Medicine

## 2021-05-08 ENCOUNTER — Other Ambulatory Visit (HOSPITAL_COMMUNITY)
Admission: RE | Admit: 2021-05-08 | Discharge: 2021-05-08 | Disposition: A | Discharge status: Home / Self Care | Payer: 59 | Source: Ambulatory Visit | Attending: Family Medicine | Admitting: Family Medicine

## 2021-05-08 VITALS — BP 114/62 | HR 62 | Wt 135.4 lb

## 2021-05-08 DIAGNOSIS — Z124 Encounter for screening for malignant neoplasm of cervix: Secondary | ICD-10-CM | POA: Diagnosis not present

## 2021-05-08 DIAGNOSIS — Z1159 Encounter for screening for other viral diseases: Secondary | ICD-10-CM | POA: Diagnosis not present

## 2021-05-08 DIAGNOSIS — Z113 Encounter for screening for infections with a predominantly sexual mode of transmission: Secondary | ICD-10-CM | POA: Diagnosis not present

## 2021-05-08 DIAGNOSIS — N898 Other specified noninflammatory disorders of vagina: Secondary | ICD-10-CM

## 2021-05-08 LAB — POCT WET PREP (WET MOUNT)
Clue Cells Wet Prep Whiff POC: POSITIVE
Trichomonas Wet Prep HPF POC: ABSENT

## 2021-05-08 NOTE — Progress Notes (Signed)
    SUBJECTIVE:   CHIEF COMPLAINT / HPI:   Audrey Hinton is a 50 yo F who presents for the below.   Pap Smear Last Pap smear 2018 with NILM, -HrHPV  No LMP recorded. Postmenopausal bleeding: Has regular periods Contraception: Not interested, not currently sexually active husband is ill.   Sexually Active: Yes  Desire for STD Screening: Yes  Last mammogram: Ordered 1/1 Concerns: Occasional fishy odor   Has received flu vaccine this year.   PERTINENT  PMH / PSH: As above.   OBJECTIVE:   BP 114/62   Pulse 62   Wt 135 lb 6.4 oz (61.4 kg)   SpO2 100%   BMI 24.29 kg/m   General: Appears well, no acute distress. Age appropriate. Respiratory: normal effort Pelvic exam: normal external genitalia, vulva, vagina, cervix, uterus and adnexa, PAP: Pap smear done today, WET MOUNT done, exam chaperoned by Maree Erie, CMA. Extremities: No edema or cyanosis. Skin: Warm and dry, no rashes noted Neuro: alert and oriented Psych: normal affect  ASSESSMENT/PLAN:   1. Screening for cervical cancer - Cytology - PAP([place])  2. Screening examination for sexually transmitted disease - HIV antibody (with reflex) - [facility]  3. Vaginal discharge Experiencing fishy odor discharge especially following her period. No pruritis. Normal periods. Not currently sexually active. Discussed unscented soaps and minimizing cloths that are reused in the area to minimize bacteria transfer.  - f/u POCT Wet Prep [facility])  4. Screening for viral disease - HIV antibody (with reflex) - Hepatitis C antibody (reflex, frozen specimen)  Lavonda Jumbo, DO [facility]

## 2021-05-08 NOTE — Patient Instructions (Addendum)
Thank you for coming in today. I will notify you via mail or phone with results when available.   Dr. Salvadore Dom

## 2021-05-09 LAB — HCV INTERPRETATION

## 2021-05-09 LAB — HCV AB W REFLEX TO QUANT PCR: HCV Ab: <0.1 s/co ratio (ref 0.0–0.9)

## 2021-05-09 LAB — RPR: RPR Ser Ql: NONREACTIVE

## 2021-05-09 LAB — HIV ANTIBODY (ROUTINE TESTING W REFLEX): HIV Screen 4th Generation wRfx: NONREACTIVE

## 2021-05-10 ENCOUNTER — Other Ambulatory Visit (HOSPITAL_COMMUNITY): Payer: Self-pay

## 2021-05-10 ENCOUNTER — Encounter (HOSPITAL_COMMUNITY): Payer: Self-pay | Admitting: Family Medicine

## 2021-05-10 ENCOUNTER — Telehealth (HOSPITAL_COMMUNITY): Payer: Self-pay | Admitting: Family Medicine

## 2021-05-10 DIAGNOSIS — B9689 Other specified bacterial agents as the cause of diseases classified elsewhere: Secondary | ICD-10-CM

## 2021-05-10 LAB — CYTOLOGY - PAP
Adequacy: ABSENT
Chlamydia: NEGATIVE
Comment: NEGATIVE
Comment: NEGATIVE
Comment: NORMAL
Diagnosis: NEGATIVE
High risk HPV: NEGATIVE
Neisseria Gonorrhea: NEGATIVE

## 2021-05-10 MED ORDER — METRONIDAZOLE 0.75 % VA GEL
1.0000 | Freq: Every day | VAGINAL | 0 refills | Status: AC
  Filled 2021-05-10: qty 70, 5d supply, fill #0

## 2021-05-10 NOTE — Telephone Encounter (Signed)
Called to discuss results. No answer. LVM to call back. Please let patient know PAP pending. Wet mount with +BV. Will send in tx to pharmacy.   Lavonda Jumbo, DO 05/10/2021, 2:28 PM PGY-3, [facility]

## 2021-05-20 ENCOUNTER — Other Ambulatory Visit (HOSPITAL_COMMUNITY): Payer: Self-pay

## 2021-07-02 ENCOUNTER — Other Ambulatory Visit: Payer: Self-pay

## 2021-07-02 DIAGNOSIS — D649 Anemia, unspecified: Secondary | ICD-10-CM | POA: Insufficient documentation

## 2021-07-02 DIAGNOSIS — A1832 Tuberculous enteritis: Secondary | ICD-10-CM | POA: Diagnosis not present

## 2021-07-02 DIAGNOSIS — R109 Unspecified abdominal pain: Secondary | ICD-10-CM | POA: Diagnosis not present

## 2021-07-02 DIAGNOSIS — K529 Noninfective gastroenteritis and colitis, unspecified: Secondary | ICD-10-CM | POA: Diagnosis not present

## 2021-07-02 DIAGNOSIS — R1031 Right lower quadrant pain: Secondary | ICD-10-CM | POA: Diagnosis not present

## 2021-07-02 DIAGNOSIS — R1030 Lower abdominal pain, unspecified: Secondary | ICD-10-CM | POA: Diagnosis present

## 2021-07-03 ENCOUNTER — Other Ambulatory Visit: Payer: Self-pay

## 2021-07-03 ENCOUNTER — Emergency Department (HOSPITAL_COMMUNITY): Payer: 59

## 2021-07-03 ENCOUNTER — Encounter (HOSPITAL_COMMUNITY): Payer: Self-pay

## 2021-07-03 ENCOUNTER — Emergency Department (HOSPITAL_COMMUNITY)
Admission: EM | Admit: 2021-07-03 | Discharge: 2021-07-03 | Disposition: A | Discharge status: Home / Self Care | Payer: 59 | Attending: Emergency Medicine | Admitting: Emergency Medicine

## 2021-07-03 DIAGNOSIS — R1030 Lower abdominal pain, unspecified: Secondary | ICD-10-CM

## 2021-07-03 DIAGNOSIS — R1031 Right lower quadrant pain: Secondary | ICD-10-CM

## 2021-07-03 DIAGNOSIS — K529 Noninfective gastroenteritis and colitis, unspecified: Secondary | ICD-10-CM

## 2021-07-03 DIAGNOSIS — A1832 Tuberculous enteritis: Secondary | ICD-10-CM | POA: Diagnosis not present

## 2021-07-03 DIAGNOSIS — R109 Unspecified abdominal pain: Secondary | ICD-10-CM | POA: Diagnosis not present

## 2021-07-03 DIAGNOSIS — D509 Iron deficiency anemia, unspecified: Secondary | ICD-10-CM

## 2021-07-03 DIAGNOSIS — D649 Anemia, unspecified: Secondary | ICD-10-CM | POA: Diagnosis not present

## 2021-07-03 LAB — CBC WITH DIFFERENTIAL/PLATELET
Abs Immature Granulocytes: 0 10*3/uL (ref 0.00–0.07)
Basophils Absolute: 0 10*3/uL (ref 0.0–0.1)
Basophils Relative: 0 %
Eosinophils Absolute: 0.1 10*3/uL (ref 0.0–0.5)
Eosinophils Relative: 1 %
HCT: 28 % — ABNORMAL LOW (ref 36.0–46.0)
Hemoglobin: 9.4 g/dL — ABNORMAL LOW (ref 12.0–15.0)
Lymphocytes Relative: 14 %
Lymphs Abs: 1 10*3/uL (ref 0.7–4.0)
MCH: 20.3 pg — ABNORMAL LOW (ref 26.0–34.0)
MCHC: 33.6 g/dL (ref 30.0–36.0)
MCV: 60.3 fL — ABNORMAL LOW (ref 80.0–100.0)
Monocytes Absolute: 0.4 10*3/uL (ref 0.1–1.0)
Monocytes Relative: 5 %
Neutro Abs: 5.8 10*3/uL (ref 1.7–7.7)
Neutrophils Relative %: 80 %
Platelets: 229 10*3/uL (ref 150–400)
RBC: 4.64 MIL/uL (ref 3.87–5.11)
RDW: 16.3 % — ABNORMAL HIGH (ref 11.5–15.5)
WBC: 7.2 10*3/uL (ref 4.0–10.5)
nRBC: 0 % (ref 0.0–0.2)
nRBC: 0 /100 WBC

## 2021-07-03 LAB — COMPREHENSIVE METABOLIC PANEL
ALT: 17 U/L (ref 0–44)
AST: 20 U/L (ref 15–41)
Albumin: 3.9 g/dL (ref 3.5–5.0)
Alkaline Phosphatase: 61 U/L (ref 38–126)
Anion gap: 6 (ref 5–15)
BUN: 9 mg/dL (ref 6–20)
CO2: 22 mmol/L (ref 22–32)
Calcium: 8.7 mg/dL — ABNORMAL LOW (ref 8.9–10.3)
Chloride: 107 mmol/L (ref 98–111)
Creatinine, Ser: 0.8 mg/dL (ref 0.44–1.00)
GFR, Estimated: >60 mL/min (ref >60)
Glucose, Bld: 94 mg/dL (ref 70–99)
Potassium: 3.7 mmol/L (ref 3.5–5.1)
Sodium: 135 mmol/L (ref 135–145)
Total Bilirubin: 1 mg/dL (ref 0.3–1.2)
Total Protein: 7.7 g/dL (ref 6.5–8.1)

## 2021-07-03 LAB — URINALYSIS, ROUTINE W REFLEX MICROSCOPIC
Bilirubin Urine: NEGATIVE
Glucose, UA: NEGATIVE mg/dL
Hgb urine dipstick: NEGATIVE
Ketones, ur: NEGATIVE mg/dL
Leukocytes,Ua: NEGATIVE
Nitrite: NEGATIVE
Protein, ur: NEGATIVE mg/dL
Specific Gravity, Urine: 1.01 (ref 1.005–1.030)
pH: 7 (ref 5.0–8.0)

## 2021-07-03 LAB — LIPASE, BLOOD: Lipase: 32 U/L (ref 11–51)

## 2021-07-03 LAB — PREGNANCY, URINE: Preg Test, Ur: NEGATIVE

## 2021-07-03 MED ORDER — IBUPROFEN 400 MG PO TABS
600.0000 mg | ORAL_TABLET | Freq: Once | ORAL | Status: AC
  Administered 2021-07-03: 600 mg via ORAL
  Filled 2021-07-03: qty 1

## 2021-07-03 MED ORDER — IOHEXOL 300 MG/ML  SOLN
80.0000 mL | Freq: Once | INTRAMUSCULAR | Status: AC | PRN
  Administered 2021-07-03: 80 mL via INTRAVENOUS

## 2021-07-03 NOTE — Discharge Instructions (Addendum)
Increase iron intake in your diet. Follow-up closely with gastroenterology for inflammation of your colon. Follow up with local doctor.  Take tylenol every 4 hrs and motrin every 6 hrs as needed for pain. Return for new concerns.

## 2021-07-03 NOTE — ED Notes (Signed)
Patient transported to CT 

## 2021-07-03 NOTE — ED Provider Notes (Signed)
[place] EMERGENCY DEPARTMENT Provider Note   CSN: [id number] Arrival date & time: 07/02/21  2335     History  Chief Complaint  Patient presents with   Abdominal Pain    Audrey Hinton is a 51 y.o. female.  Patient presents with right lower abdominal pain and general lower discomfort intermittent since yesterday.  No history of similar.  No vaginal symptoms, no vomiting or diarrhea.  No blood in the stools.  Patient has history reflux no history of appendicitis or gallbladder problems.  No significant weight loss however patient not weighed herself recently.      Home Medications Prior to Admission medications   Medication Sig Start Date End Date Taking? Authorizing Provider  esomeprazole (NEXIUM) 20 MG capsule Take 1 capsule (20 mg total) by mouth daily at 12 noon. Patient taking differently: Take 20 mg by mouth daily as needed (heartburn, indigestion). 04/23/21  Yes Wells Guiles, DO  meclizine (ANTIVERT) 25 MG tablet Take 1 tablet (25 mg total) by mouth 3 (three) times daily as needed for dizziness. Patient not taking: Reported on 07/03/2021 12/14/19   Nuala Alpha, MD      Allergies    Patient has no known allergies.    Review of Systems   Review of Systems  Constitutional:  Negative for chills and fever.  HENT:  Negative for congestion.   Eyes:  Negative for visual disturbance.  Respiratory:  Negative for shortness of breath.   Cardiovascular:  Negative for chest pain.  Gastrointestinal:  Positive for abdominal pain. Negative for vomiting.  Genitourinary:  Negative for dysuria and flank pain.  Musculoskeletal:  Negative for back pain, neck pain and neck stiffness.  Skin:  Negative for rash.  Neurological:  Negative for light-headedness and headaches.   Physical Exam Updated Vital Signs BP 116/65    Pulse 69    Temp 98.2 F (36.8 C)    Resp 16    Ht 5\' 2"  (1.575 m)    Wt 62 kg    SpO2 100%    BMI 25.00 kg/m  Physical Exam Vitals and  nursing note reviewed.  Constitutional:      General: She is not in acute distress.    Appearance: She is well-developed.  HENT:     Head: Normocephalic and atraumatic.     Mouth/Throat:     Mouth: Mucous membranes are moist.  Eyes:     General:        Right eye: No discharge.        Left eye: No discharge.     Conjunctiva/sclera: Conjunctivae normal.  Neck:     Trachea: No tracheal deviation.  Cardiovascular:     Rate and Rhythm: Normal rate and regular rhythm.     Heart sounds: No murmur heard. Pulmonary:     Effort: Pulmonary effort is normal.     Breath sounds: Normal breath sounds.  Abdominal:     General: There is no distension.     Palpations: Abdomen is soft.     Tenderness: There is abdominal tenderness (minimal) in the right lower quadrant. There is no guarding.  Musculoskeletal:     Cervical back: Normal range of motion and neck supple. No rigidity.  Skin:    General: Skin is warm.     Capillary Refill: Capillary refill takes less than 2 seconds.     Findings: No rash.  Neurological:     General: No focal deficit present.     Mental Status: She is  alert.     Cranial Nerves: No cranial nerve deficit.  Psychiatric:        Mood and Affect: Mood normal.    ED Results / Procedures / Treatments   Labs (all labs ordered are listed, but only abnormal results are displayed) Labs Reviewed  COMPREHENSIVE METABOLIC PANEL - Abnormal; Notable for the following components:      Result Value   Calcium 8.7 (*)    All other components within normal limits  CBC WITH DIFFERENTIAL/PLATELET - Abnormal; Notable for the following components:   Hemoglobin 9.4 (*)    HCT 28.0 (*)    MCV 60.3 (*)    MCH 20.3 (*)    RDW 16.3 (*)    All other components within normal limits  LIPASE, BLOOD  URINALYSIS, ROUTINE W REFLEX MICROSCOPIC  PREGNANCY, URINE    EKG None  Radiology CT ABDOMEN PELVIS W CONTRAST  Result Date: 07/03/2021 CLINICAL DATA:  Right lower quadrant abdominal  pain. EXAM: CT ABDOMEN AND PELVIS WITH CONTRAST TECHNIQUE: Multidetector CT imaging of the abdomen and pelvis was performed using the standard protocol following bolus administration of intravenous contrast. CONTRAST:  8mL OMNIPAQUE IOHEXOL 300 MG/ML  SOLN COMPARISON:  None. FINDINGS: Lower chest: Clear lung bases. Hepatobiliary: No focal liver abnormality is seen. No gallstones, gallbladder wall thickening, or biliary dilatation. Pancreas: Unremarkable. No pancreatic ductal dilatation or surrounding inflammatory changes. Spleen: Normal in size without focal abnormality. Adrenals/Urinary Tract: Adrenal glands are unremarkable. Kidneys are normal, without renal calculi, focal lesion, or hydronephrosis. Bladder is unremarkable. Stomach/Bowel: Heterogeneous diffuse wall thickening involving the terminal ileum and right colon. Normal appearing appendix. Unremarkable stomach. Vascular/Lymphatic: No significant vascular findings are present. No enlarged abdominal or pelvic lymph nodes. Reproductive: 1.7 cm right ovarian corpus luteum cyst. Mildly heterogeneous uterus. Normal appearing left ovary. Other: Minimal free peritoneal fluid in the right pelvis. Musculoskeletal: Mild lumbar spine degenerative changes with mild levoconvex scoliosis. IMPRESSION: 1. Ileocolitis involving the distal ileum and right colon. This is nonspecific and commonly seen with Crohn's disease. This could be infectious. 2. No evidence of appendicitis. Electronically Signed   By: Claudie Revering M.D.   On: 07/03/2021 09:26    Procedures Procedures    Medications Ordered in ED Medications  ibuprofen (ADVIL) tablet 600 mg (600 mg Oral Given 07/03/21 0857)  iohexol (OMNIPAQUE) 300 MG/ML solution 80 mL (80 mLs Intravenous Contrast Given 07/03/21 0913)    ED Course/ Medical Decision Making/ A&P                           Medical Decision Making  Patient presents with lower abdominal discomfort intermittent since yesterday differential  including early appendicitis, colitis, enteritis, urine infection, other.  Patient overall well-appearing no guarding minimal discomfort.  Since this is new for patient CT scan ordered for further delineation.  Blood work ordered and reviewed showing normal white count, hemoglobin 9 with significant microcytic component.  Discussed with patient increasing iron intake likely iron deficiency.  Kidney function reassuring, electrolytes unremarkable reviewed. Urinalysis no signs of blood to suggest kidney stone, no signs of infection. CT scan results reviewed showing ileocolitis mild, no signs of appendicitis.  Patient stable to follow-up with primary doctor/gastroenterology, work note given and ibuprofen given for pain in the ER.           Final Clinical Impression(s) / ED Diagnoses Final diagnoses:  Right lower quadrant abdominal pain  Microcytic anemia  Ileocolitis  Lower abdominal pain  Rx / DC Orders ED Discharge Orders     None         Elnora Morrison, MD 07/03/21 [phone]

## 2021-07-03 NOTE — ED Triage Notes (Signed)
Pt arrives POV for eval of RLQ abd pain. Pt reports blt LQ pain, worse on R. Pt reports pain is squeezing in nature. Last BM today. Denies vag bleeding/DC. Denies N/V/D.

## 2021-07-03 NOTE — ED Provider Triage Note (Signed)
Emergency Medicine Provider Triage Evaluation Note  Audrey Hinton , a 51 y.o. female  was evaluated in triage.  Pt complains of intermittent abdominal pain.  Patient states it started last night and has been worsening.  No modifying factors.  States it started in her central abdomen and migrated into the lower abdomen which she states is right greater than left.  No nausea, vomiting, diarrhea, vaginal discharge, dysuria.  Physical Exam  BP (!) 116/57 (BP Location: Right Arm)    Pulse 75    Temp 99.1 F (37.3 C) (Oral)    Resp 15    Ht 5\' 2"  (1.575 m)    Wt 62 kg    SpO2 100%    BMI 25.00 kg/m  Gen:   Awake, no distress   Resp:  Normal effort  MSK:   Moves extremities without difficulty  Other:    Medical Decision Making  Medically screening exam initiated at 12:23 AM.  Appropriate orders placed.  Audrey Hinton was informed that the remainder of the evaluation will be completed by another provider, this initial triage assessment does not replace that evaluation, and the importance of remaining in the ED until their evaluation is complete.   , PA-C 07/03/21 0024

## 2021-07-05 ENCOUNTER — Ambulatory Visit: Payer: 59 | Admitting: Gastroenterology

## 2021-07-26 ENCOUNTER — Other Ambulatory Visit (INDEPENDENT_AMBULATORY_CARE_PROVIDER_SITE_OTHER): Payer: 59

## 2021-07-26 ENCOUNTER — Other Ambulatory Visit (HOSPITAL_COMMUNITY): Payer: Self-pay

## 2021-07-26 ENCOUNTER — Ambulatory Visit (INDEPENDENT_AMBULATORY_CARE_PROVIDER_SITE_OTHER): Payer: 59 | Admitting: Gastroenterology

## 2021-07-26 ENCOUNTER — Encounter: Payer: Self-pay | Admitting: Gastroenterology

## 2021-07-26 VITALS — BP 106/80 | HR 72 | Ht 62.0 in | Wt 134.4 lb

## 2021-07-26 DIAGNOSIS — D509 Iron deficiency anemia, unspecified: Secondary | ICD-10-CM | POA: Diagnosis not present

## 2021-07-26 DIAGNOSIS — R933 Abnormal findings on diagnostic imaging of other parts of digestive tract: Secondary | ICD-10-CM

## 2021-07-26 DIAGNOSIS — R1031 Right lower quadrant pain: Secondary | ICD-10-CM | POA: Diagnosis not present

## 2021-07-26 LAB — FERRITIN: Ferritin: 3.2 ng/mL — ABNORMAL LOW (ref 10.0–291.0)

## 2021-07-26 LAB — FOLATE: Folate: >24.2 ng/mL (ref >5.9)

## 2021-07-26 LAB — VITAMIN B12: Vitamin B-12: 626 pg/mL (ref 211–911)

## 2021-07-26 LAB — IBC PANEL
Iron: 56 ug/dL (ref 42–145)
Saturation Ratios: 13.6 % — ABNORMAL LOW (ref 20.0–50.0)
TIBC: 413 ug/dL (ref 250.0–450.0)
Transferrin: 295 mg/dL (ref 212.0–360.0)

## 2021-07-26 LAB — C-REACTIVE PROTEIN: CRP: <1 mg/dL (ref 0.5–20.0)

## 2021-07-26 MED ORDER — NA SULFATE-K SULFATE-MG SULF 17.5-3.13-1.6 GM/177ML PO SOLN
1.0000 | Freq: Once | ORAL | 0 refills | Status: AC
  Filled 2021-07-26: qty 354, 1d supply, fill #0

## 2021-07-26 NOTE — Patient Instructions (Signed)
If you are age 53 or older, your body mass index should be between 23-30. Your Body mass index is 24.58 kg/m. If this is out of the aforementioned range listed, please consider follow up with your Primary Care Provider.  If you are age 65 or younger, your body mass index should be between 19-25. Your Body mass index is 24.58 kg/m. If this is out of the aformentioned range listed, please consider follow up with your Primary Care Provider.   Your provider has requested that you go to the basement level for lab work before leaving today. Press "B" on the elevator. The lab is located at the first door on the left as you exit the elevator.  You have been scheduled for a colonoscopy. Please follow written instructions given to you at your visit today.  Please pick up your prep supplies at the pharmacy within the next 1-3 days. If you use inhalers (even only as needed), please bring them with you on the day of your procedure.    The  GI providers would like to encourage you to use [facility] to communicate with providers for non-urgent requests or questions.  Due to long hold times on the telephone, sending your provider a message by [facility] may be a faster and more efficient way to get a response.  Please allow 48 business hours for a response.  Please remember that this is for non-urgent requests.   It was a pleasure to see you today!  Thank you for trusting me with your gastrointestinal care!    Scott E.Tomasa Rand, MD

## 2021-07-26 NOTE — Progress Notes (Signed)
HPI : Audrey Hinton is a very pleasant 51 year old with a history of GERD who is referred to [name] by Dr. Alen Bleacher for further evaluation of abdominal pain and abnormal CT scan.  She states that she developed abrupt pain in the RLQ which had persisted for 2 days before she presented to the ED on Jan 11th. Her labs were unremarkable, but a CT showed inflammatory changes/wall thickening in the terminal ileum and cecum. She was recommended to take Tylenol or Motrin to help with pain.  NO antibiotics given.  She denied fevers/chills.  She had some nausea, no vomiting.  Mild diarrhea, no blood in the stool.  The pain improved greatly, but she continues to have mild attacks of pain which she treats with Tylenol or Motrin.  Her bowel movements are normal.  Weight is stable and appetite is good.   She has long standing iron deficiency anemia and has taken iron pills off and on in the past, but they tend to make her nauseated.   She denies any history of chronic abdominal pain, diarrhea or blood in the stool.  No family history of IBD or GI malignancy.    Past Medical History:  Diagnosis Date   GERD (gastroesophageal reflux disease)      Past Surgical History:  Procedure Laterality Date   BLADDER SURGERY     5 YRS AGO   Family History  Problem Relation Age of Onset   Hypertension Mother    Hypertension Father    Stroke Father    Social History   Tobacco Use   Smoking status: Never   Smokeless tobacco: Never  Vaping Use   Vaping Use: Never used  Substance Use Topics   Alcohol use: No   Drug use: No  Works in Landscape architect at [employer]  Current Outpatient Medications  Medication Sig Dispense Refill   esomeprazole (NEXIUM) 20 MG capsule Take 1 capsule (20 mg total) by mouth daily at 12 noon. (Patient not taking: Reported on 07/26/2021) 30 capsule 2   meclizine (ANTIVERT) 25 MG tablet Take 1 tablet (25 mg total) by mouth 3 (three) times daily as needed for dizziness. (Patient not  taking: Reported on 07/03/2021) 30 tablet 0   No current facility-administered medications for this visit.   No Known Allergies   Review of Systems: All systems reviewed and negative except where noted in HPI.    CT ABDOMEN PELVIS W CONTRAST  Result Date: 07/03/2021 CLINICAL DATA:  Right lower quadrant abdominal pain. EXAM: CT ABDOMEN AND PELVIS WITH CONTRAST TECHNIQUE: Multidetector CT imaging of the abdomen and pelvis was performed using the standard protocol following bolus administration of intravenous contrast. CONTRAST:  66mL OMNIPAQUE IOHEXOL 300 MG/ML  SOLN COMPARISON:  None. FINDINGS: Lower chest: Clear lung bases. Hepatobiliary: No focal liver abnormality is seen. No gallstones, gallbladder wall thickening, or biliary dilatation. Pancreas: Unremarkable. No pancreatic ductal dilatation or surrounding inflammatory changes. Spleen: Normal in size without focal abnormality. Adrenals/Urinary Tract: Adrenal glands are unremarkable. Kidneys are normal, without renal calculi, focal lesion, or hydronephrosis. Bladder is unremarkable. Stomach/Bowel: Heterogeneous diffuse wall thickening involving the terminal ileum and right colon. Normal appearing appendix. Unremarkable stomach. Vascular/Lymphatic: No significant vascular findings are present. No enlarged abdominal or pelvic lymph nodes. Reproductive: 1.7 cm right ovarian corpus luteum cyst. Mildly heterogeneous uterus. Normal appearing left ovary. Other: Minimal free peritoneal fluid in the right pelvis. Musculoskeletal: Mild lumbar spine degenerative changes with mild levoconvex scoliosis. IMPRESSION: 1. Ileocolitis involving the distal  ileum and right colon. This is nonspecific and commonly seen with Crohn's disease. This could be infectious. 2. No evidence of appendicitis. Electronically Signed   By: Claudie Revering M.D.   On: 07/03/2021 09:26    Physical Exam: BP 106/80 (BP Location: Left Arm, Patient Position: Sitting, Cuff Size: Normal)    Pulse  72    Ht 5\' 2"  (1.575 m)    Wt 134 lb 6 oz (61 kg)    LMP 07/08/2021    BMI 24.58 kg/m  Constitutional: Pleasant,well-developed, [place] female in no acute distress. HEENT: Normocephalic and atraumatic. Conjunctivae are normal. No scleral icterus. Neck supple.  Cardiovascular: Normal rate, regular rhythm.  Pulmonary/chest: Effort normal and breath sounds normal. No wheezing, rales or rhonchi. Abdominal: Soft, nondistended, mild tenderness to deep palpation in the RLQ, no rigidity/guarding. Bowel sounds active throughout. There are no masses palpable. No hepatomegaly. Extremities: no edema Neurological: Alert and oriented to person place and time. Skin: Skin is warm and dry. No rashes noted. Psychiatric: Normal mood and affect. Behavior is normal.  CBC    Component Value Date/Time   WBC 7.2 07/03/2021 0026   RBC 4.64 07/03/2021 0026   HGB 9.4 (L) 07/03/2021 0026   HGB 11.2 12/14/2019 1258   HCT 28.0 (L) 07/03/2021 0026   HCT 36.9 12/14/2019 1258   PLT 229 07/03/2021 0026   PLT 282 12/14/2019 1258   MCV 60.3 (L) 07/03/2021 0026   MCV 68 (L) 12/14/2019 1258   MCH 20.3 (L) 07/03/2021 0026   MCHC 33.6 07/03/2021 0026   RDW 16.3 (H) 07/03/2021 0026   RDW 19.4 (H) 12/14/2019 1258   LYMPHSABS 1.0 07/03/2021 0026   MONOABS 0.4 07/03/2021 0026   EOSABS 0.1 07/03/2021 0026   BASOSABS 0.0 07/03/2021 0026    CMP     Component Value Date/Time   NA 135 07/03/2021 0026   NA 139 12/14/2019 1258   K 3.7 07/03/2021 0026   CL 107 07/03/2021 0026   CO2 22 07/03/2021 0026   GLUCOSE 94 07/03/2021 0026   BUN 9 07/03/2021 0026   BUN 14 12/14/2019 1258   CREATININE 0.80 07/03/2021 0026   CREATININE 0.89 09/02/2010 1107   CALCIUM 8.7 (L) 07/03/2021 0026   PROT 7.7 07/03/2021 0026   PROT 7.7 12/14/2019 1258   ALBUMIN 3.9 07/03/2021 0026   ALBUMIN 4.7 12/14/2019 1258   AST 20 07/03/2021 0026   ALT 17 07/03/2021 0026   ALKPHOS 61 07/03/2021 0026   BILITOT 1.0 07/03/2021 0026   BILITOT  0.5 12/14/2019 1258   GFRNONAA >60 07/03/2021 0026   GFRAA 86 12/14/2019 1258   Component Ref Range & Units 3 wk ago (07/03/21) 1 yr ago (12/14/19) 6 yr ago (09/06/14) 10 yr ago (09/02/10) 10 yr ago (09/02/10) 12 yr ago (09/07/08) 13 yr ago (11/26/07)  WBC 4.0 - 10.5 K/uL 7.2  3.5 R  3.8 Low   3.4 Low  R  3.4 Low   3.9 Low  R  4.4 R   RBC 3.87 - 5.11 MIL/uL 4.64  5.42 High  R  5.67 High   5.15 High  R  5.15 High   4.87 R  5.53 High  R   Hemoglobin 12.0 - 15.0 g/dL 9.4 Low   11.2 R  11.7 Low   10.4 Low   10.4 Low   8.5 Low   10.5 Low    HCT 36.0 - 46.0 % 28.0 Low   36.9 R  38.7  32.3 Low  32.3 Low   27.5 Low   33.3 Low    MCV 80.0 - 100.0 fL 60.3 Low   68 Low  R  68.3 Low  R  62.7 Low  R  62.7 Low  R  56.5 Low  R  60.2 Low  R   MCH 26.0 - 34.0 pg 20.3 Low   20.7 Low  R  20.6 Low    20.2 Low      MCHC 30.0 - 36.0 g/dL 33.6  30.4 Low  R  30.2  32.2  32.2  30.9  31.5   RDW 11.5 - 15.5 % 16.3 High   19.4 High  R  19.6 High   15.7 High   15.7 High   19.3 High   17.4 High    Platelets 150 - 400 K/uL 229  282 R  254  278  278  230  152     ASSESSMENT AND PLAN: 51 year old female with 2 days of severe RLQ pain, found to have inflammatory changes/bowel wall thickening of the terminal ileum/cecum, much improved, but not completely resolved.  The sudden onset and short duration of acute symptoms makes an infectious etiology, but the persistence of mild pain, and the absence of other symptoms such as diarrhea, nausea/vomiting, fevers/chills.  Her iron deficiency is very long standing in the absence of GI symptoms, and is most likely related to menstrual blood loss.  Will check iron panel today and pt states she will try oral iron tablets again if low.  Will check B12 level given ileal inflammation A colonoscopy is indicated to further evaluate for Crohn's disease or mass lesion (less likely).  Will get CRP; if elevated, will expedite colonoscopy.  The details, risks (including bleeding, perforation,  infection, missed lesions, medication reactions and possible hospitalization or surgery if complications occur), benefits, and alternatives to colonoscopy with possible biopsy and possible polypectomy were discussed with the patient and she consents to proceed.   Lance Huaracha E. Candis Schatz, MD [facility]  CC:  Alen Bleacher, MD

## 2021-07-28 ENCOUNTER — Encounter: Payer: Self-pay | Admitting: Gastroenterology

## 2021-07-29 ENCOUNTER — Other Ambulatory Visit (HOSPITAL_COMMUNITY): Payer: Self-pay

## 2021-08-01 NOTE — Progress Notes (Signed)
Vaughan Basta,  Can you please contact Ms. Heitzenrater and let her know that her iron levels were low and that she should restart oral iron supplements.  If she needs a prescription, please give ferrous sulfate 325 mg t1 tb po daily #90 rf3 Her other labs were normal.   Thanks

## 2021-08-02 ENCOUNTER — Other Ambulatory Visit (HOSPITAL_COMMUNITY): Payer: Self-pay

## 2021-08-02 ENCOUNTER — Other Ambulatory Visit: Payer: Self-pay

## 2021-08-02 MED ORDER — FERROUS SULFATE 325 (65 FE) MG PO TBEC
325.0000 mg | DELAYED_RELEASE_TABLET | Freq: Every day | ORAL | 3 refills | Status: DC
  Filled 2021-08-02: qty 90, 90d supply, fill #0

## 2021-09-05 ENCOUNTER — Encounter: Payer: Self-pay | Admitting: Gastroenterology

## 2021-09-10 ENCOUNTER — Encounter: Payer: Self-pay | Admitting: Gastroenterology

## 2021-09-10 ENCOUNTER — Ambulatory Visit (AMBULATORY_SURGERY_CENTER): Payer: 59 | Admitting: Gastroenterology

## 2021-09-10 VITALS — BP 111/59 | HR 71 | Temp 97.3°F | Resp 17 | Ht 62.0 in | Wt 134.0 lb

## 2021-09-10 DIAGNOSIS — R933 Abnormal findings on diagnostic imaging of other parts of digestive tract: Secondary | ICD-10-CM

## 2021-09-10 DIAGNOSIS — R1031 Right lower quadrant pain: Secondary | ICD-10-CM

## 2021-09-10 MED ORDER — SODIUM CHLORIDE 0.9 % IV SOLN: 500.0000 mL | Freq: Once | INTRAVENOUS | Status: DC

## 2021-09-10 NOTE — Op Note (Signed)
[facility] ?Patient Name: Audrey Hinton ?Procedure Date: 09/10/2021 11:27 AM ?MRN: [id number] ?Endoscopist: Kanesha Cadle E. Candis Schatz , MD ?Age: 51 ?Referring MD:  ?Date of Birth: [birth year] ?Gender: Female ?Account #: [id number] ?Procedure:                Colonoscopy ?Indications:              Abnormal CT of the GI tract ?Medicines:                Monitored Anesthesia Care ?Procedure:                Pre-Anesthesia Assessment: ?                          - Prior to the procedure, a History and Physical  ?                          was performed, and patient medications and  ?                          allergies were reviewed. The patient's tolerance of  ?                          previous anesthesia was also reviewed. The risks  ?                          and benefits of the procedure and the sedation  ?                          options and risks were discussed with the patient.  ?                          All questions were answered, and informed consent  ?                          was obtained. Prior Anticoagulants: The patient has  ?                          taken no previous anticoagulant or antiplatelet  ?                          agents. ASA Grade Assessment: II - A patient with  ?                          mild systemic disease. After reviewing the risks  ?                          and benefits, the patient was deemed in  ?                          satisfactory condition to undergo the procedure. ?                          After obtaining informed consent, the colonoscope  ?  was passed under direct vision. Throughout the  ?                          procedure, the patient's blood pressure, pulse, and  ?                          oxygen saturations were monitored continuously. The  ?                          Olympus [id number]) Colonoscope was  ?                          introduced through the anus and advanced to the 10  ?                          cm into the ileum. The  colonoscopy was performed  ?                          without difficulty. The patient tolerated the  ?                          procedure well. The quality of the bowel  ?                          preparation was good. The terminal ileum, the  ?                          ileocecal valve and the appendiceal orifice were  ?                          photographed. ?Scope In: 11:39:47 AM ?Scope Out: 11:54:24 AM ?Scope Withdrawal Time: 0 hours 11 minutes 34 seconds  ?Total Procedure Duration: 0 hours 14 minutes 37 seconds  ?Findings:                 The perianal and digital rectal examinations were  ?                          normal. Pertinent negatives include normal  ?                          sphincter tone and no palpable rectal lesions. ?                          The colon (entire examined portion) appeared  ?                          normal. Multiple attempts at rectal retroflexion  ?                          were attempted, but unsuccessful due to narrow  ?                          rectal vault ?  The terminal ileum appeared normal. ?Complications:            No immediate complications. ?Estimated Blood Loss:     Estimated blood loss: none. ?Impression:               - The entire examined colon is normal. ?                          - The examined portion of the ileum was normal. ?                          - No specimens collected. ?                          - The patient's abdominal pain and inflammatory  ?                          changes in the cecum and terminal ileum can be  ?                          attributed to acute infectious enterocolitis. No  ?                          further evaluation is recommended. ?Recommendation:           - Patient has a contact number available for  ?                          emergencies. The signs and symptoms of potential  ?                          delayed complications were discussed with the  ?                          patient. Return to normal  activities tomorrow.  ?                          Written discharge instructions were provided to the  ?                          patient. ?                          - Resume previous diet. ?                          - Continue present medications. ?                          - Repeat colonoscopy in 10 years for screening  ?                          purposes. ?Harwood Nall E. Candis Schatz, MD ?09/10/2021 12:00:22 PM ?This report has been signed electronically. ?

## 2021-09-10 NOTE — Patient Instructions (Addendum)
HM NAY QU V? ? TH?C HI?N TH? THU?T N?I SOI T?I TRUNG TM N?I SOI Gilson: Vui lng xem bo co th? thu?t ? ???c g?i cho qu v?, n?u qu v? c b?t k? th?c m?c g trong su?t qu trnh th?m khm. N?u bo co th? thu?t khng th? gi?i ?p th?c m?c c?a qu v?, vui lng g?i cho bc s? chuyn khoa tiu ha c?a qu v? ?? ???c gi?i ?p. N?u qu v? ? yu c?u khng cung c?p thng tin chi ti?t v? k?t qu? th? thu?t cho ??i tc ch?m sc c?a qu v?, th bo co th? thu?t s? ???c g?i trong m?t phong b ???c dn kn ?? qu v? xem khi thu?n ti?n.   QU V? C TH?: C?m gic ch??ng b?ng. Trung ti?n nhi?u h?n bnh th??ng. ?i b? c th? gip ??y ra ngoi khng kh ?i vo ???ng tiu ha trong khi th?c hi?n th? thu?t v gi?m ch??ng b?ng. N?u qu v? ti?n hnh n?i soi d??i (nh? n?i soi ??i trng ho?c soi k?t trng xch-ma b?ng ?ng m?m), qu v? c th? th?y cc ch?m mu ? phn ho?c trn gi?y v? sinh. N?u qu v? ? lm s?ch ??i trng ?? th?c hi?n th? thu?t, qu v? c th? khng ?i ??i ti?n nh? bnh th??ng trong vi ngy.  Vui Lng L?u : Qu v? c th? b? kch ?ng v ngh?t m?i ho?c ch?y n??c m?i. Tnh tr?ng ny l do ?nh h??ng c?a vi?c th? bnh oxy trong qu trnh th?c hi?n th? thu?t. Qu v? khng c?n lo l?ng, tnh tr?ng ny s? bi?n m?t sau m?t ho?c vi ngy.   CC TRI?U CH?NG C?N BO CO NGAY  Sau khi th?c hi?n n?i soi d??i (n?i soi ??i trng ho?c soi k?t trng xch-ma b?ng ?ng m?m): Phn c nhi?u mu ?au b?ng d? d?i ho?c ngy cng t?ng Xu?t hi?n v?t s?ng b?ng m?i, c?p tnh S?t t? 100F tr? ln   Sau khi th?c hi?n n?i soi trn (EGD)  Nn ra mu ho?c ch?t mu c ph s?m Xu?t hi?n c?n ?au ng?c ho?c ?au d??i x??ng b? vai m?i Nu?t ?au ho?c kh nu?t M?i b? kh th? S?t t? 100F tr? ln Phn ?en nh? m?c  ??i v?i cc v?n ?? kh?n c?p ho?c c?p c?u, qu v? c th? lin h? bc s? chuyn khoa tiu ha b?t k? lc no b?ng cch g?i ??n s? [phone].   CH? ?? ?N U?NG: Chng ti khuyn qu v? tr??c tin nn ?n nh?, nh?ng sau ? qu v? c th? ?n  theo ch? ?? bnh th??ng. U?ng nhi?u n??c nh?ng ph?i trnh ?? u?ng c c?n trong 24 gi?.  HO?T ??NG: Qu v? c?n ln k? ho?ch ?? ngh? ng?i trong ngy hm nay v KHNG NN LI XE ho?c s? d?ng my mc n?ng cho ??n ngy mai (do tc d?ng c?a thu?c an th?n s? d?ng trong th? thu?t).   THEO DI: Nhn vin c?a chng ti s? g?i cho qu v? theo s? ?i?n tho?i trong b?nh n vo ngy lm vi?c ti?p theo sau ngy th?c hi?n th? thu?t ?? ki?m tra tnh tr?ng c?a qu v? v gi?i ?p cc cu h?i ho?c th?c m?c c?a qu v? v? thng tin m qu v? ???c cung c?p sau khi th?c hi?n th? thu?t. N?u chng ti khng lin l?c ???c v?i qu v?, chng ti s? ?? l?i tin nh?n. Tuy nhin, n?u qu v? c?m th?y kh?e   v khng g?p b?t k? s? c? no, qu v? khng c?n g?i l?i cho chng ti. Chng ti s? gi? ??nh r?ng qu v? ? tr? l?i sinh ho?t bnh th??ng v khng g?p b?t k? s? c? no.  N?u qu v? ???c l?y sinh thi?t, chng ti s? lin l?c v?i qu v? qua ?i?n tho?i ho?c th? trong 1-3 tu?n ti?p theo. Vui lng g?i cho chng ti theo s? [phone] n?u qu v? khng nh?n ???c thng tin v? k?t qu? sinh thi?t trong 3 tu?n.  CH? K/B?O M?T: Qu v? v/ho?c ??i tc ch?m sc c?a qu v? ? k vo cc ti li?u s? ???c nh?p vo h? s? y t? ?i?n t? c?a qu v?. Ch? k ny xc nh?n r?ng cc thng tin trn ?y trong B?n Tm T?t Sau Khi Th?m Khm c?a qu v? ? ???c xem xt v hi?u r. Qu v? v/ho?c   

## 2021-09-10 NOTE — Progress Notes (Signed)
[facility] History and Physical ? ? ?Primary Care Physician:  Jerre Simon, MD ? ? ?Reason for Procedure:   Abnormal imaging of the GI tract ? ?Plan:    Colonoscopy ? ? ? ? ?HPI: Audrey Hinton is a 51 y.o. female who developed severe abdominal pain in early January and went to the ED where she was found to have inflammatory changes in the terminal ileum and cecum.  Her pain improved greatly after a few days, but she continues to have occasional episodes of abdominal pain in the RLQ.  She has chronic iron deficiency anemia which preceded the onset of her GI symptoms. ? ? ?Past Medical History:  ?Diagnosis Date  ? GERD (gastroesophageal reflux disease)   ? ? ?Past Surgical History:  ?Procedure Laterality Date  ? BLADDER SURGERY    ? 8 YRS AGO  ? ? ?Prior to Admission medications   ?Medication Sig Start Date End Date Taking? Authorizing Provider  ?ferrous sulfate 325 (65 FE) MG EC tablet Take 1 tablet (325 mg total) by mouth daily with breakfast. 08/02/21   Jenel Lucks, MD  ?esomeprazole (NEXIUM) 20 MG capsule Take 1 capsule (20 mg total) by mouth daily at 12 noon. ?Patient not taking: Reported on 07/26/2021 04/23/21   [name], DO  ?meclizine (ANTIVERT) 25 MG tablet Take 1 tablet (25 mg total) by mouth 3 (three) times daily as needed for dizziness. ?Patient not taking: Reported on 07/03/2021 12/14/19   Arlyce Harman, MD  ? ? ?Current Outpatient Medications  ?Medication Sig Dispense Refill  ? ferrous sulfate 325 (65 FE) MG EC tablet Take 1 tablet (325 mg total) by mouth daily with breakfast. 90 tablet 3  ? esomeprazole (NEXIUM) 20 MG capsule Take 1 capsule (20 mg total) by mouth daily at 12 noon. (Patient not taking: Reported on 07/26/2021) 30 capsule 2  ? meclizine (ANTIVERT) 25 MG tablet Take 1 tablet (25 mg total) by mouth 3 (three) times daily as needed for dizziness. (Patient not taking: Reported on 07/03/2021) 30 tablet 0  ? ?Current Facility-Administered Medications  ?Medication Dose Route  Frequency Provider Last Rate Last Admin  ? 0.9 %  sodium chloride infusion  500 mL Intravenous Once Jenel Lucks, MD      ? ? ?Allergies as of 09/10/2021  ? (No Known Allergies)  ? ? ?Family History  ?Problem Relation Age of Onset  ? Hypertension Mother   ? Hypertension Father   ? Stroke Father   ? ? ?Social History  ? ?Socioeconomic History  ? Marital status: Married  ?  Spouse name: Not on file  ? Number of children: 4  ? Years of education: Not on file  ? Highest education level: Not on file  ?Occupational History  ? Occupation: Cleaning  ?Tobacco Use  ? Smoking status: Never  ? Smokeless tobacco: Never  ?Vaping Use  ? Vaping Use: Never used  ?Substance and Sexual Activity  ? Alcohol use: No  ? Drug use: No  ? Sexual activity: Yes  ?  Birth control/protection: I.U.D.  ?Other Topics Concern  ? Not on file  ?Social History Narrative  ? Lives in [place] with Husband Denton Lank) and 4 children (Bet Tarnowski 19, Doet Mccree 16, Suu Sleep 13, Ammy Colombe 5)  ? Emigrated to [name] 2002.  ? [place], speaks some [place], also speaks [place]   ? Works in Stage manager at [employer]  ? No smoking/alcohol use/no drugs  ? ?Social Determinants of Health  ? ?Physicist, medical  Strain: Not on file  ?Food Insecurity: Not on file  ?Transportation Needs: Not on file  ?Physical Activity: Not on file  ?Stress: Not on file  ?Social Connections: Not on file  ?Intimate Partner Violence: Not on file  ? ? ?Review of Systems: ? ?All other review of systems negative except as mentioned in the HPI. ? ?Physical Exam: ?Vital signs ?BP 120/73   Pulse 66   Temp (!) 97.3 ?F (36.3 ?C)   Ht 5\' 2"  (1.575 m)   Wt 134 lb (60.8 kg)   LMP 09/02/2021   SpO2 100%   BMI 24.51 kg/m?  ? ?General:   Alert,  Well-developed, well-nourished, pleasant and cooperative in NAD ?Airway:  Mallampati 1 ?Lungs:  Clear throughout to auscultation.   ?Heart:  Regular rate and rhythm; no murmurs, clicks, rubs,  or gallops. ?Abdomen:  Soft, nontender and  nondistended. Normal bowel sounds.   ?Neuro/Psych:  Normal mood and affect. A and O x 3 ? ? ?Montford Barg E. Candis Schatz, MD ?[facility] ? ?

## 2021-09-10 NOTE — Progress Notes (Signed)
Sedate, gd SR, tolerated procedure well, VSS, report to RN 

## 2021-09-10 NOTE — Progress Notes (Signed)
VS-CW  Pt's states no medical or surgical changes since previsit or office visit.  

## 2021-09-12 ENCOUNTER — Telehealth: Payer: Self-pay

## 2021-09-12 ENCOUNTER — Telehealth: Payer: Self-pay | Admitting: *Deleted

## 2021-09-12 NOTE — Telephone Encounter (Signed)
Second attempt, left VM.  

## 2021-09-12 NOTE — Telephone Encounter (Signed)
Attempted f/u call. VM left with [place] interpreter.  ?

## 2021-11-25 ENCOUNTER — Ambulatory Visit (HOSPITAL_COMMUNITY)
Admission: EM | Admit: 2021-11-25 | Discharge: 2021-11-25 | Disposition: A | Discharge status: Home / Self Care | Payer: 59 | Attending: Physician Assistant | Admitting: Physician Assistant

## 2021-11-25 ENCOUNTER — Other Ambulatory Visit (HOSPITAL_COMMUNITY): Payer: Self-pay

## 2021-11-25 ENCOUNTER — Encounter (HOSPITAL_COMMUNITY): Payer: Self-pay

## 2021-11-25 DIAGNOSIS — H00011 Hordeolum externum right upper eyelid: Secondary | ICD-10-CM | POA: Diagnosis not present

## 2021-11-25 MED ORDER — SULFACETAMIDE SODIUM 10 % OP SOLN
2.0000 [drp] | OPHTHALMIC | 0 refills | Status: AC
  Filled 2021-11-25 (×2): qty 15, 25d supply, fill #0

## 2021-11-25 NOTE — ED Triage Notes (Signed)
Onset 3 days ago in the right eye. States never had this before. Pain has worsened over the 3 days. No discharge. No known injury to the eye.

## 2021-11-25 NOTE — ED Provider Notes (Signed)
[facility]    CSN: [id number] Arrival date & time: 11/25/21  0849      History   Chief Complaint Chief Complaint  Patient presents with   Stye    HPI Audrey Hinton is a 51 y.o. female.   51 year old female presents with a stye on the right upper eyelid.  Patient relates for the past 4 days she has had a redness and swelling developed of the right upper eyelid lateral portion patient relates she does not have any vision changes, the area is mildly painful, she has been using warm compresses frequently which has helped reduce the swelling.  Patient relates she is not having fever or chills, and she does not have any other symptoms associated with this.  Patient relates she has not traumatized the area.  She is concerned about thinking swelling and the redness since she has never had a stye.    Past Medical History:  Diagnosis Date   GERD (gastroesophageal reflux disease)     Patient Active Problem List   Diagnosis Date Noted   Trapezius muscle strain 04/08/2017   Right-sided low back pain with right-sided sciatica 12/27/2014   Well adult exam 09/06/2014   GERD 09/07/2008   DIZZINESS 09/07/2008    Past Surgical History:  Procedure Laterality Date   BLADDER SURGERY     15 YRS AGO    OB History     Gravida  5   Para  4   Term      Preterm      AB  1   Living  4      SAB  1   IAB      Ectopic      Multiple      Live Births               Home Medications    Prior to Admission medications   Medication Sig Start Date End Date Taking? Authorizing Provider  ferrous sulfate 325 (65 FE) MG EC tablet Take 1 tablet (325 mg total) by mouth daily with breakfast. 08/02/21   Jenel Lucks, MD  sulfacetamide (BLEPH-10) 10 % ophthalmic solution Place 2 drops into the right eye every 4 (four) hours. 11/25/21  Yes Ellsworth Lennox, PA-C  esomeprazole (NEXIUM) 20 MG capsule Take 1 capsule (20 mg total) by mouth daily at 12 noon. Patient not taking:  Reported on 07/26/2021 04/23/21   [name], DO  meclizine (ANTIVERT) 25 MG tablet Take 1 tablet (25 mg total) by mouth 3 (three) times daily as needed for dizziness. Patient not taking: Reported on 07/03/2021 12/14/19   Arlyce Harman, MD    Family History Family History  Problem Relation Age of Onset   Hypertension Mother    Hypertension Father    Stroke Father     Social History Social History   Tobacco Use   Smoking status: Never   Smokeless tobacco: Never  Vaping Use   Vaping Use: Never used  Substance Use Topics   Alcohol use: No   Drug use: No     Allergies   Patient has no known allergies.   Review of Systems Review of Systems  Eyes:  Positive for redness (upper right eyelid swelling).    Physical Exam Triage Vital Signs ED Triage Vitals  Enc Vitals Group     BP 11/25/21 0936 111/61     Pulse Rate 11/25/21 0936 64     Resp 11/25/21 0936 16     Temp  11/25/21 0936 98.2 F (36.8 C)     Temp Source 11/25/21 0936 Oral     SpO2 11/25/21 0936 98 %     Weight 11/25/21 0938 125 lb (56.7 kg)     Height 11/25/21 0938 5\' 3"  (1.6 m)     Head Circumference --      Peak Flow --      Pain Score 11/25/21 0938 6     Pain Loc --      Pain Edu? --      Excl. in GC? --    No data found.  Updated Vital Signs BP 111/61 (BP Location: Left Arm)   Pulse 64   Temp 98.2 F (36.8 C) (Oral)   Resp 16   Ht 5\' 3"  (1.6 m)   Wt 125 lb (56.7 kg)   LMP 10/27/2021 (Approximate)   SpO2 98%   BMI 22.14 kg/m   Visual Acuity Right Eye Distance:   Left Eye Distance:   Bilateral Distance:    Right Eye Near:   Left Eye Near:    Bilateral Near:     Physical Exam Constitutional:      Appearance: Normal appearance.  Eyes:      Comments: Eyes: Left and eyelids are normal. Right: Conjunctiva is normal, there is moderate swelling of the upper eyelid lateral margin, a stye is present, moderate redness without drainage, no pointing at present time.  Lymphadenopathy:      Cervical: No cervical adenopathy.  Neurological:     Mental Status: She is alert.     UC Treatments / Results  Labs (all labs ordered are listed, but only abnormal results are displayed) Labs Reviewed - No data to display  EKG   Radiology No results found.  Procedures Procedures (including critical care time)  Medications Ordered in UC Medications - No data to display  Initial Impression / Assessment and Plan / UC Course  I have reviewed the triage vital signs and the nursing notes.  Pertinent labs & imaging results that were available during my care of the patient were reviewed by me and considered in my medical decision making (see chart for details).    Plan: 1.  Advised to use the acetaminophen eyedrops 2 drops to the eye every 4 hours.  Treating the stye. 2 days.  Continue with warm compresses frequently throughout the day. 3.  Advised to observe and to consider ophthalmology evaluation if the stye fails to improve and resolve over the next week to 10 days. Final Clinical Impressions(s) / UC Diagnoses   Final diagnoses:  Hordeolum externum of right upper eyelid     Discharge Instructions      Advised to continue using warm compresses frequently throughout the day. To use the eyedrops 1 to 2 drops in the about every 4 hours. Advised to follow-up with PCP or return to urgent care if symptoms fail to improve over the next week, consider neurology evaluation.    ED Prescriptions     Medication Sig Dispense Auth. Provider   sulfacetamide (BLEPH-10) 10 % ophthalmic solution Place 2 drops into the right eye every 4 (four) hours. 15 mL , PA-C      PDMP not reviewed this encounter.   12/27/2021, PA-C 11/25/21 1011

## 2021-11-25 NOTE — Discharge Instructions (Signed)
Advised to continue using warm compresses frequently throughout the day. To use the eyedrops 1 to 2 drops in the about every 4 hours. Advised to follow-up with PCP or return to urgent care if symptoms fail to improve over the next week, consider neurology evaluation.

## 2021-11-26 ENCOUNTER — Other Ambulatory Visit (HOSPITAL_COMMUNITY): Payer: Self-pay

## 2021-11-27 ENCOUNTER — Other Ambulatory Visit (HOSPITAL_COMMUNITY): Payer: Self-pay

## 2022-09-07 IMAGING — CT CT ABD-PELV W/ CM
2 of 5 series · 17 of 46 positions shown, 19 images · IV contrast (omnipaque)
Comparison: None.

CLINICAL DATA: Right lower quadrant abdominal pain.

EXAM:
CT ABDOMEN AND PELVIS WITH CONTRAST
TECHNIQUE: Multidetector CT imaging of the abdomen and pelvis was performed
using the standard protocol following bolus administration of
intravenous contrast.
CONTRAST:  80mL OMNIPAQUE IOHEXOL 300 MG/ML  SOLN

[Series 3: abdomen 5.0 · axial · 0.80mm/px · z∈[+771,+1136]mm · 14 of 85 slices shown, 16 images]
[im 6/85  soft-tissue]
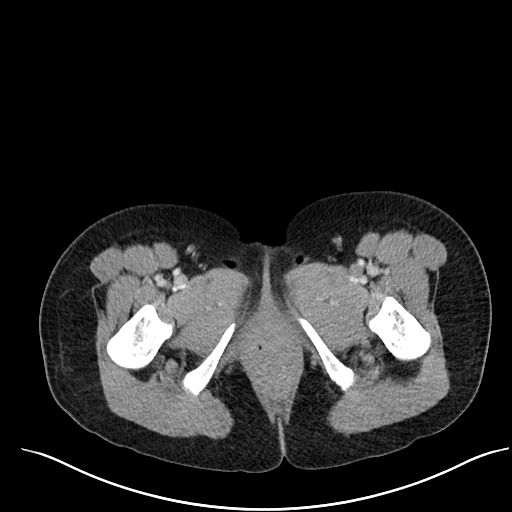
[im 6/85  bone]
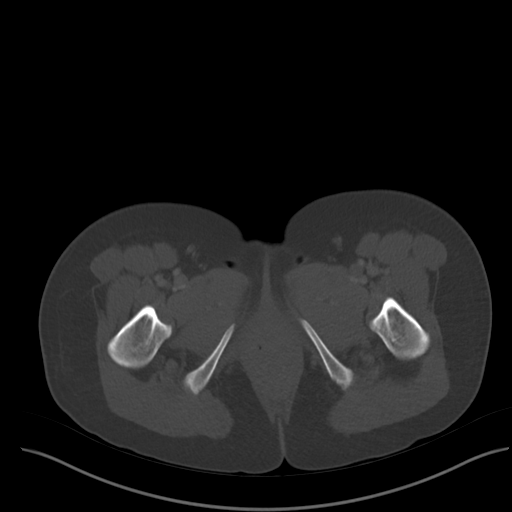
[im 12/85  soft-tissue]
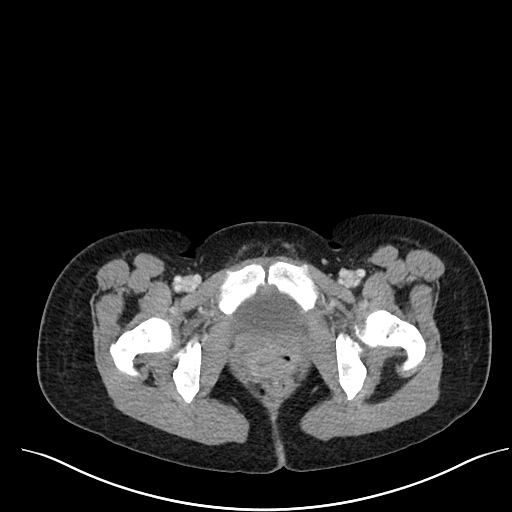
[im 17/85  soft-tissue]
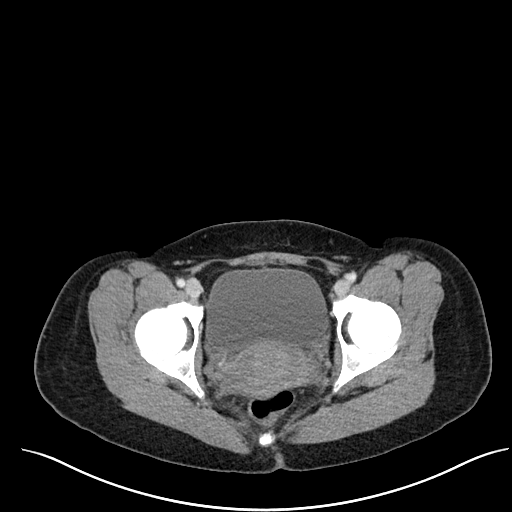
[im 23/85  soft-tissue]
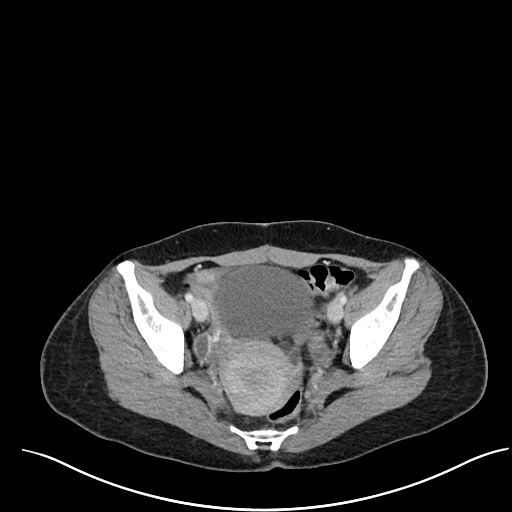
[im 29/85  soft-tissue]
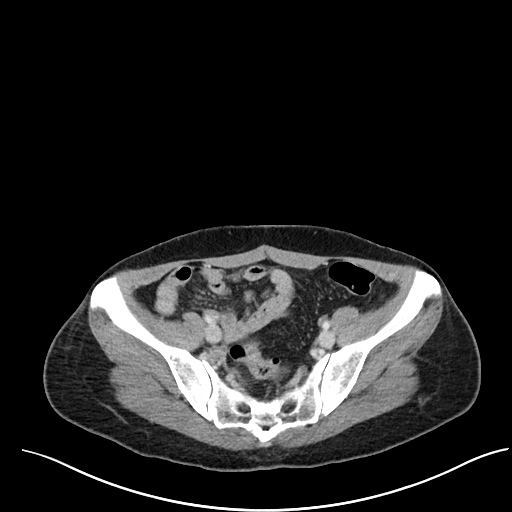
[im 34/85  soft-tissue]
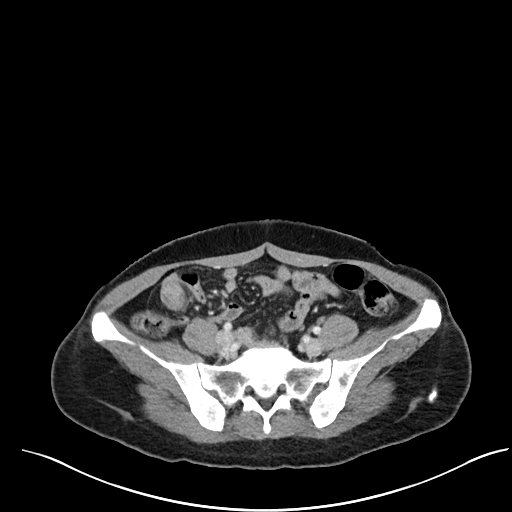
[im 40/85  soft-tissue]
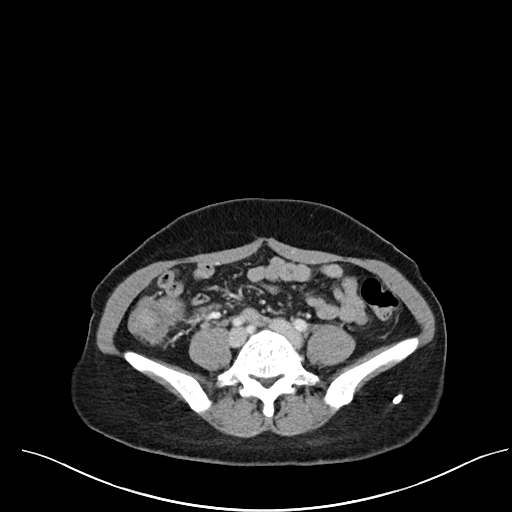
[im 45/85  soft-tissue]
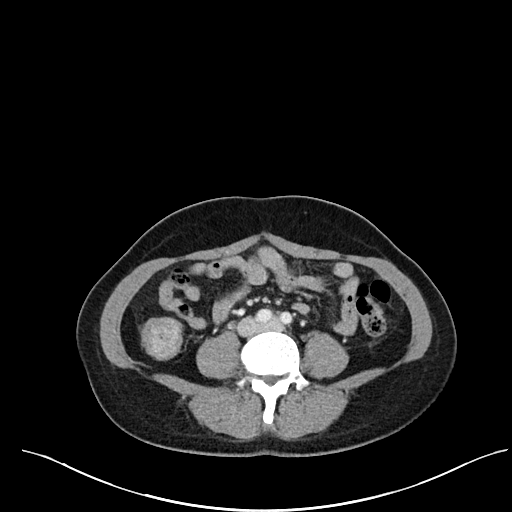
[im 51/85  soft-tissue]
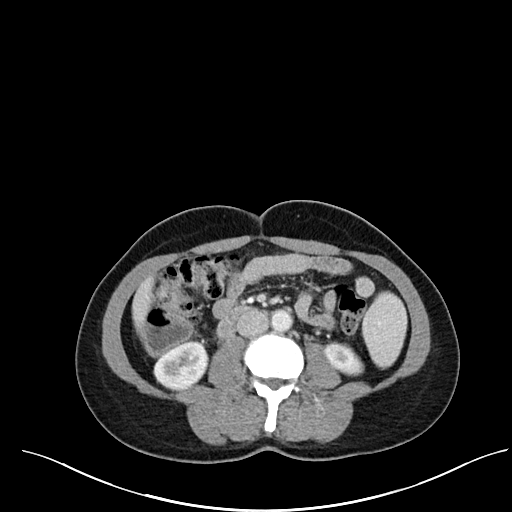
[im 51/85  bone]
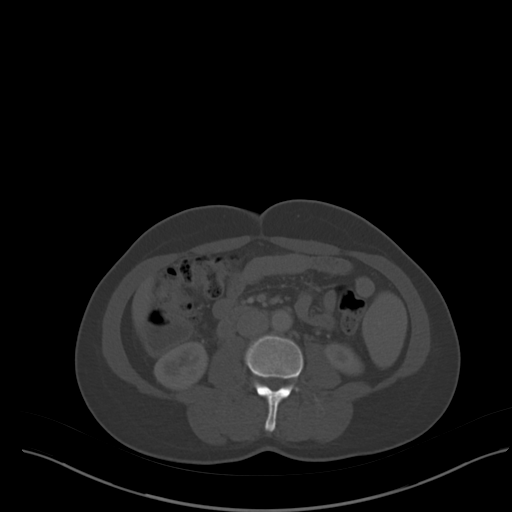
[im 57/85  soft-tissue]
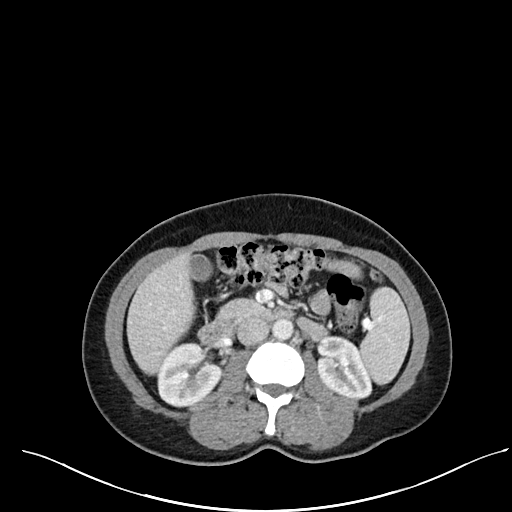
[im 62/85  soft-tissue]
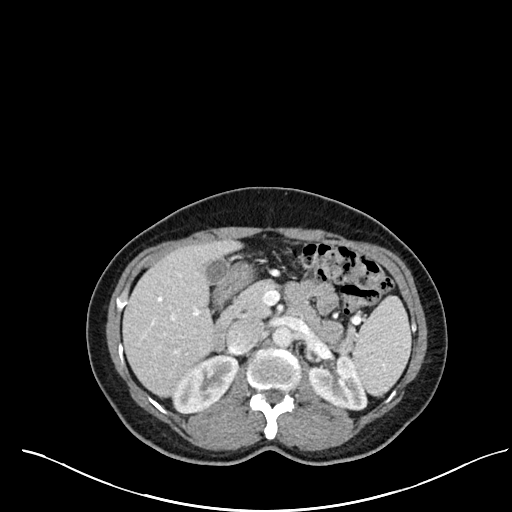
[im 68/85  soft-tissue]
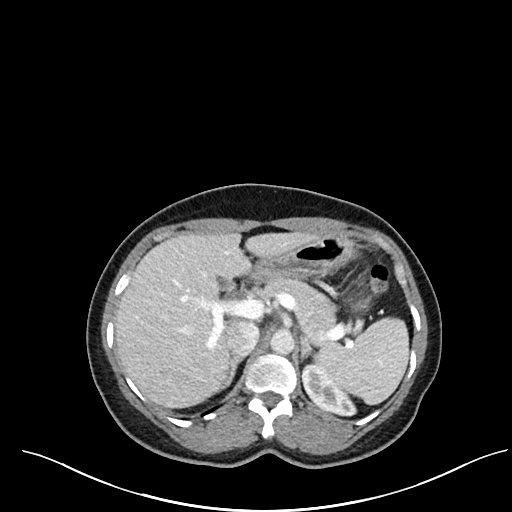
[im 73/85  soft-tissue]
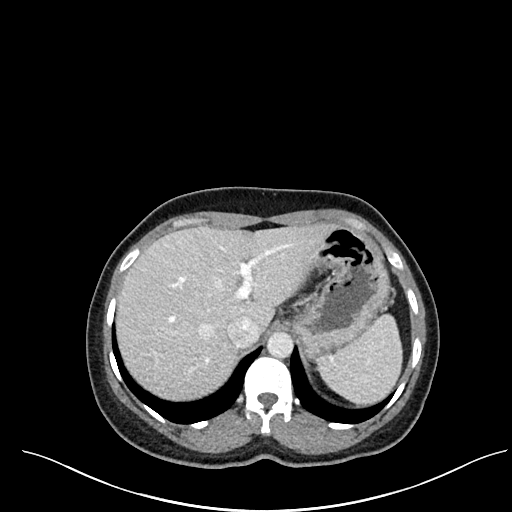
[im 79/85  soft-tissue]
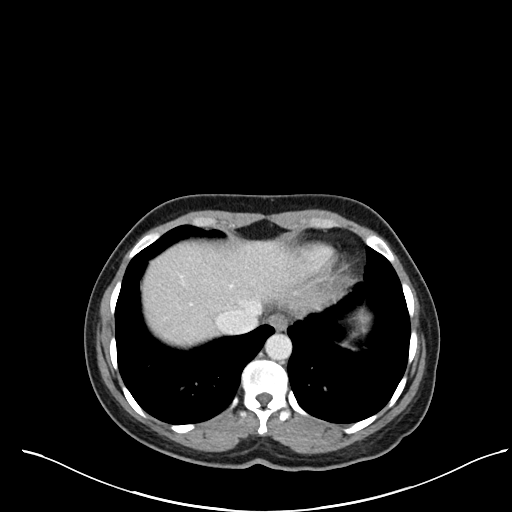

[Series 6: abdomen 3.0 mpr cor · coronal · 0.73mm/px · 3 of 81 slices shown]
[im 27/81  soft-tissue]
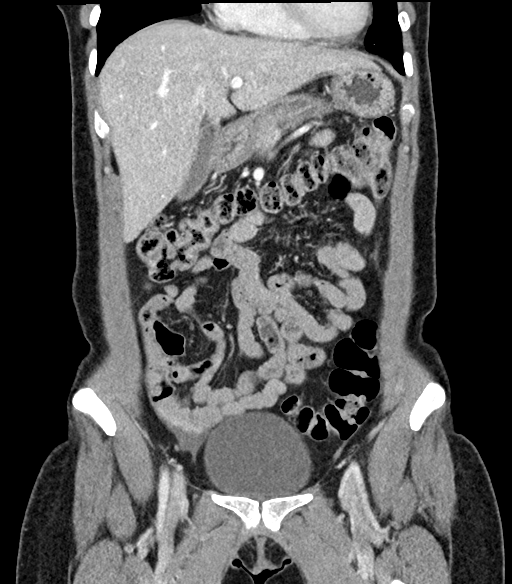
[im 36/81  soft-tissue]
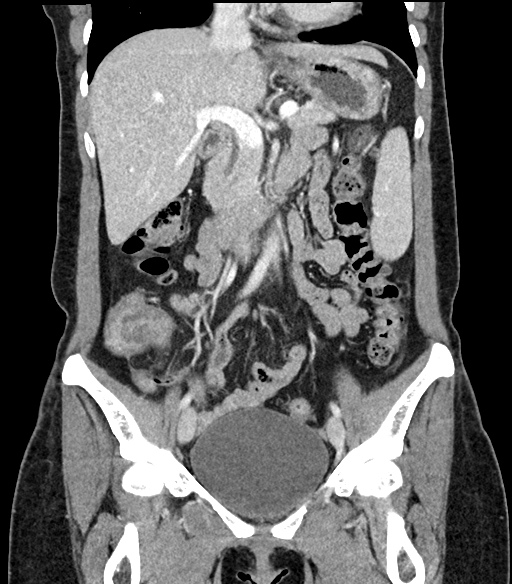
[im 45/81  soft-tissue]
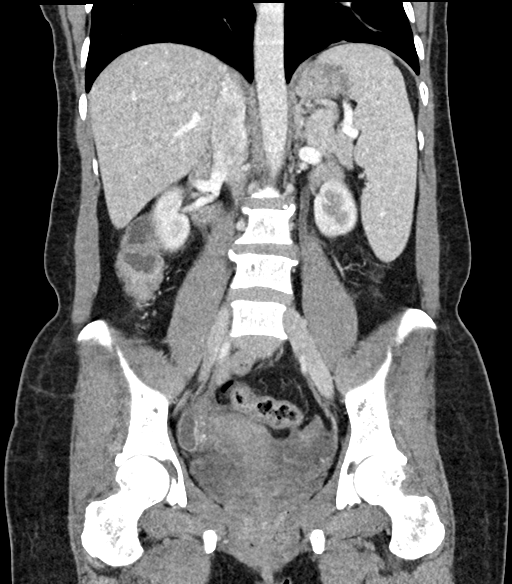

[17 of 46 positions shown; findings below may reference images not displayed]

FINDINGS: Lower chest: Clear lung bases.

Hepatobiliary: No focal liver abnormality is seen. No gallstones,
gallbladder wall thickening, or biliary dilatation.

Pancreas: Unremarkable. No pancreatic ductal dilatation or
surrounding inflammatory changes.

Spleen: Normal in size without focal abnormality.

Adrenals/Urinary Tract: Adrenal glands are unremarkable. Kidneys are
normal, without renal calculi, focal lesion, or hydronephrosis.
Bladder is unremarkable.

Stomach/Bowel: Heterogeneous diffuse wall thickening involving the
terminal ileum and right colon. Normal appearing appendix.
Unremarkable stomach.

Vascular/Lymphatic: No significant vascular findings are present. No
enlarged abdominal or pelvic lymph nodes.

Reproductive: 1.7 cm right ovarian corpus luteum cyst. Mildly
heterogeneous uterus. Normal appearing left ovary.

Other: Minimal free peritoneal fluid in the right pelvis.

Musculoskeletal: Mild lumbar spine degenerative changes with mild
levoconvex scoliosis.
IMPRESSION: 1. Ileocolitis involving the distal ileum and right colon. This is
nonspecific and commonly seen with Crohn's disease. This could be
infectious.
2. No evidence of appendicitis.

## 2024-02-24 ENCOUNTER — Other Ambulatory Visit: Payer: Self-pay

## 2024-02-24 ENCOUNTER — Ambulatory Visit

## 2024-02-24 VITALS — BP 121/63 | HR 61 | Ht 63.0 in | Wt 134.2 lb

## 2024-02-24 DIAGNOSIS — Z23 Encounter for immunization: Secondary | ICD-10-CM | POA: Diagnosis not present

## 2024-02-24 DIAGNOSIS — Z0000 Encounter for general adult medical examination without abnormal findings: Secondary | ICD-10-CM | POA: Diagnosis not present

## 2024-02-24 DIAGNOSIS — D509 Iron deficiency anemia, unspecified: Secondary | ICD-10-CM | POA: Diagnosis not present

## 2024-02-24 NOTE — Progress Notes (Signed)
    SUBJECTIVE:   Chief compliant/HPI: annual examination  Audrey Hinton is a 53 y.o. who presents today for an annual exam.   History tabs reviewed and updated.   Review of systems form reviewed and non contributory.   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   Only notable hx is iron deficiency anemia. Iron supplements gave her some GI issues in the past. She stopped menstruating early this year.   No other acute concerns today.   OBJECTIVE:   BP 121/63   Pulse 61   Ht 5' 3 (1.6 m)   Wt 134 lb 3.2 oz (60.9 kg)   LMP 12/24/2023   SpO2 100%   BMI 23.77 kg/m    General: Awake, alert, NAD. Communicates clearly. HEENT: NCAT, dentition . PERRLA EOMI. Anicteric sclera, conjunctiva clear. Nares patent w/o discharge, no turbinate inflammation. MMM, clear OP w/ no exudates or erythema.  Cardio: RRR. Normal S1, S2. No murmur, rub, gallop. 2+ radial and dorsalis pedis pulses b/l w/ good capillary refill.  Resp: CTA bilaterally. No wheezes, rales, or rhonchi. Normal work of breathing on room air Abdomen: soft, non-tender, non-distended. Normoactive BS auscultated. No guarding, rigidity, or rebound. Negative Murphy's. No tenderness at McBurney's point.  Extremities: Full ROM w/ no TTP or obvious deformity. No swelling or sign of injury. No venous stasis changes. No clubbing, cyanosis, or edema.  Skin: good skin turgor, no rash or lesions appreciated, no abnormal nevi Neuro: AOx4. Bilateral UE and LE strength 5/5. Sensation to light touch intact and symmetrical bilaterally. Psych: Appropriate mood and affect. No SI/HI.   ASSESSMENT/PLAN:   Assessment & Plan Well adult exam Screening mammogram ordered Hep B titers ordered Iron deficiency anemia, unspecified iron deficiency anemia type Prior hx of iron deficiency anemia, did not tolerate oral iron supplementation. Reports minimal symptoms, but happy to recheck today. Born in [place]. CBC w/ diff Iron  panel Hemoglobin electrophoresis Encounter for immunization Flu shot today   Annual Examination  See AVS for age appropriate recommendations  PHQ score 4, reviewed and discussed.  BP reviewed and at goal .  Asked about intimate partner violence and resources given as appropriate   Considered the following items based upon USPSTF recommendations: Diabetes screening: prior A1c 4.7  Screening for elevated cholesterol: discussed HIV testing: discussed Hepatitis C: discussed Hepatitis B: discussed, titers collected today Syphilis if at high risk: discussed GC/CT not at high risk and not ordered. Osteoporosis screening considered based upon risk of fracture from [facility] calculator. Patient not at risk, does not meet age criteria.  Reviewed risk factors for latent tuberculosis and not indicated  Discussed family history, BRCA testing not indicated. No fhx.  Cervical cancer screening: prior Pap reviewed, repeat due in 2027 Breast cancer screening: discussed potential benefits, risks including overdiagnosis and biopsy, elected proceed with mammogram Colorectal cancer screening: up to date on screening for CRC. Lung cancer screening: discussed. See documentation below regarding indications/risks/benefits.  Vaccinations - Flu, recommended pneumovax at the pharmacy, was not available in clinic today.   Follow up in 1 year or sooner if indicated.  MyChart Activation: Signed up today  Leafy Scriver, DO [facility]

## 2024-02-24 NOTE — Addendum Note (Signed)
 Addended by: Tanyiah Laurich L on: 02/24/2024 10:11 AM   Modules accepted: Orders

## 2024-02-24 NOTE — Assessment & Plan Note (Signed)
 Screening mammogram ordered Hep B titers ordered

## 2024-02-24 NOTE — Patient Instructions (Signed)
 It was great to see you today!  You are in excellent health. Your blood pressure, blood sugar, and cholesterol all look great. You are taking care of your health extremely well, and I encourage you to continue to stay active and exercise to maintain your strength and energy.   Today we'll get blood tests for your iron deficiency, to check for other causes of anemia, and to see if you have antibodies against hepatitis B.   I'd also like you to get a mammogram to screen for breast changes that put you at risk for future breast cancer. This is standard for women your age and we should do it every 2 years.   Thank you for choosing [facility].   Please call [phone] with any questions about today's appointment.  Leafy Scriver, DO Family Medicine

## 2024-02-25 ENCOUNTER — Ambulatory Visit: Payer: Self-pay

## 2024-02-25 ENCOUNTER — Telehealth: Payer: Self-pay | Admitting: Family Medicine

## 2024-02-25 ENCOUNTER — Telehealth: Payer: Self-pay

## 2024-02-25 DIAGNOSIS — D582 Other hemoglobinopathies: Secondary | ICD-10-CM

## 2024-02-25 DIAGNOSIS — D509 Iron deficiency anemia, unspecified: Secondary | ICD-10-CM | POA: Insufficient documentation

## 2024-02-25 LAB — IRON AND TIBC
Iron Saturation: 8 % — CL (ref 15–55)
Iron: 31 ug/dL (ref 27–159)
Total Iron Binding Capacity: 390 ug/dL (ref 250–450)
UIBC: 359 ug/dL (ref 131–425)

## 2024-02-25 LAB — FERRITIN: Ferritin: 8 ng/mL — ABNORMAL LOW (ref 15–150)

## 2024-02-25 NOTE — Telephone Encounter (Signed)
 Auth Submission: NO AUTH NEEDED Site of care: [facility] Payer: [employer] commercial Medication & CPT/J Code(s) submitted: Venofer (Iron Sucrose) J1756 Diagnosis Code:  Route of submission (phone, fax, portal):  Phone # Fax # Auth type: Buy/Bill PB Units/visits requested: 200mg  x 5 doses Reference number:  Approval from: 02/25/24 to 06/22/24

## 2024-02-25 NOTE — Addendum Note (Signed)
 Addended by: DELORES, Placido Hangartner on: 02/25/2024 09:14 AM   Modules accepted: Orders

## 2024-02-25 NOTE — Telephone Encounter (Signed)
 Patient referred to infusion pharmacy team for ambulatory infusion of IV iron.  Insurance - [employer] PPO Site of care - Site of care: [facility] Dx code - D50.9 IV Iron Therapy - Venofer 200 mg IV x 5  Infusion appointments - Scheduling team will schedule patient as soon as possible.    Bharat Antillon D. Adrianne Shackleton, PharmD

## 2024-02-25 NOTE — Progress Notes (Signed)
 Spoke to pt on the phone 02/25/24 AM. Explained the results of her labs including persistent and slightly worsened IDA. She reiterated no sign of blood in her BM that she could tell. Explained that she'd receive a call to set up IV iron appt as well as repeat GI workup given concern for occult bleeding / IDA etiology. Encouraged her to establish mychart through the phone number provided yesterday and/or to call the office w/ further questions. Patient verbalized understanding and agrees w/ plan.

## 2024-02-29 LAB — HGB FRACTIONATION BY HPLC
Hgb A: 0 % — ABNORMAL LOW (ref 96.4–98.8)
Hgb C: 0 %
Hgb E: 94.6 % — ABNORMAL HIGH
Hgb S: 0 %
Hgb Variant: 0 %

## 2024-02-29 LAB — CBC WITH DIFFERENTIAL/PLATELET
Basophils Absolute: 0 x10E3/uL (ref 0.0–0.2)
Basos: 1 %
EOS (ABSOLUTE): 0.1 x10E3/uL (ref 0.0–0.4)
Eos: 2 %
Hematocrit: 31.2 % — ABNORMAL LOW (ref 34.0–46.6)
Hemoglobin: 8.5 g/dL — ABNORMAL LOW (ref 11.1–15.9)
Immature Grans (Abs): 0 x10E3/uL (ref 0.0–0.1)
Immature Granulocytes: 0 %
Lymphocytes Absolute: 1.3 x10E3/uL (ref 0.7–3.1)
Lymphs: 44 %
MCH: 16.5 pg — ABNORMAL LOW (ref 26.6–33.0)
MCHC: 27.2 g/dL — ABNORMAL LOW (ref 31.5–35.7)
MCV: 61 fL — ABNORMAL LOW (ref 79–97)
Monocytes Absolute: 0.2 x10E3/uL (ref 0.1–0.9)
Monocytes: 8 %
Neutrophils Absolute: 1.3 x10E3/uL — ABNORMAL LOW (ref 1.4–7.0)
Neutrophils: 45 %
Platelets: 224 x10E3/uL (ref 150–450)
RBC: 5.15 x10E6/uL (ref 3.77–5.28)
RDW: 23.8 % — ABNORMAL HIGH (ref 11.7–15.4)
WBC: 2.9 x10E3/uL — ABNORMAL LOW (ref 3.4–10.8)

## 2024-02-29 LAB — HGB FRACTIONATION CASCADE
Hgb A2: 4.9 % — ABNORMAL HIGH (ref 1.8–3.2)
Hgb F: 0.5 % (ref 0.0–2.0)

## 2024-02-29 LAB — HEPATITIS B SURFACE ANTIBODY, QUANTITATIVE: Hepatitis B Surf Ab Quant: 3294 m[IU]/mL

## 2024-03-08 ENCOUNTER — Ambulatory Visit (INDEPENDENT_AMBULATORY_CARE_PROVIDER_SITE_OTHER)

## 2024-03-08 VITALS — BP 126/76 | HR 53 | Temp 97.8°F | Resp 16 | Ht 63.0 in | Wt 135.2 lb

## 2024-03-08 DIAGNOSIS — D509 Iron deficiency anemia, unspecified: Secondary | ICD-10-CM | POA: Diagnosis not present

## 2024-03-08 MED ORDER — IRON SUCROSE 20 MG/ML IV SOLN
200.0000 mg | Freq: Once | INTRAVENOUS | Status: AC
  Administered 2024-03-08: 200 mg via INTRAVENOUS
  Filled 2024-03-08: qty 10

## 2024-03-08 NOTE — Progress Notes (Signed)
 Diagnosis: Iron Deficiency Anemia  Provider:  Chilton Greathouse MD  Procedure: IV Push  IV Type: Peripheral, IV Location: R Antecubital  Venofer (Iron Sucrose), Dose: 200 mg  Post Infusion IV Care: Observation period completed and Peripheral IV Discontinued  Discharge: Condition: Good, Destination: Home . AVS Provided  Performed by:  Adriana Mccallum, RN

## 2024-03-08 NOTE — Patient Instructions (Signed)

## 2024-03-10 ENCOUNTER — Ambulatory Visit (INDEPENDENT_AMBULATORY_CARE_PROVIDER_SITE_OTHER)

## 2024-03-10 VITALS — BP 111/71 | HR 57 | Temp 97.7°F | Resp 16 | Ht 63.0 in | Wt 135.4 lb

## 2024-03-10 DIAGNOSIS — D509 Iron deficiency anemia, unspecified: Secondary | ICD-10-CM | POA: Diagnosis not present

## 2024-03-10 MED ORDER — IRON SUCROSE 20 MG/ML IV SOLN
200.0000 mg | Freq: Once | INTRAVENOUS | Status: AC
  Administered 2024-03-10: 200 mg via INTRAVENOUS

## 2024-03-10 MED ORDER — IRON SUCROSE 200 MG IVPB - SIMPLE MED: 200.0000 mg | Freq: Once | Status: DC

## 2024-03-10 NOTE — Progress Notes (Signed)
 Diagnosis: Iron Deficiency Anemia  Provider:  Chilton Greathouse MD  Procedure: IV Push  IV Type: Peripheral, IV Location: R Antecubital  Venofer (Iron Sucrose), Dose: 200 mg  Post Infusion IV Care: Observation period completed and Peripheral IV Discontinued  Discharge: Condition: Good, Destination: Home . AVS Declined  Performed by:  Rico Ala, LPN

## 2024-03-11 ENCOUNTER — Ambulatory Visit
Admission: RE | Admit: 2024-03-11 | Discharge: 2024-03-11 | Disposition: A | Discharge status: Home / Self Care | Source: Ambulatory Visit | Attending: Family Medicine | Admitting: Family Medicine

## 2024-03-11 DIAGNOSIS — Z0000 Encounter for general adult medical examination without abnormal findings: Secondary | ICD-10-CM

## 2024-03-11 DIAGNOSIS — Z1231 Encounter for screening mammogram for malignant neoplasm of breast: Secondary | ICD-10-CM | POA: Diagnosis not present

## 2024-03-14 ENCOUNTER — Ambulatory Visit (INDEPENDENT_AMBULATORY_CARE_PROVIDER_SITE_OTHER)

## 2024-03-14 VITALS — BP 127/77 | HR 60 | Temp 97.9°F | Resp 16 | Ht 63.0 in | Wt 133.4 lb

## 2024-03-14 DIAGNOSIS — D509 Iron deficiency anemia, unspecified: Secondary | ICD-10-CM

## 2024-03-14 MED ORDER — IRON SUCROSE 20 MG/ML IV SOLN
200.0000 mg | Freq: Once | INTRAVENOUS | Status: AC
  Administered 2024-03-14: 200 mg via INTRAVENOUS
  Filled 2024-03-14: qty 10

## 2024-03-14 NOTE — Progress Notes (Signed)
 Diagnosis: Iron Deficiency Anemia  Provider:  Chilton Greathouse MD  Procedure: IV Push  IV Type: Peripheral, IV Location: R Antecubital  Venofer (Iron Sucrose), Dose: 200 mg  Post Infusion IV Care: Observation period completed and Peripheral IV Discontinued  Discharge: Condition: Good, Destination: Home . AVS Declined  Performed by:  Rico Ala, LPN

## 2024-03-16 ENCOUNTER — Other Ambulatory Visit: Payer: Self-pay | Admitting: Family Medicine

## 2024-03-16 ENCOUNTER — Ambulatory Visit

## 2024-03-16 VITALS — BP 108/70 | HR 57 | Temp 98.4°F | Resp 18 | Ht 63.0 in | Wt 133.8 lb

## 2024-03-16 DIAGNOSIS — R928 Other abnormal and inconclusive findings on diagnostic imaging of breast: Secondary | ICD-10-CM

## 2024-03-16 DIAGNOSIS — N6489 Other specified disorders of breast: Secondary | ICD-10-CM

## 2024-03-16 DIAGNOSIS — D509 Iron deficiency anemia, unspecified: Secondary | ICD-10-CM

## 2024-03-16 DIAGNOSIS — N6320 Unspecified lump in the left breast, unspecified quadrant: Secondary | ICD-10-CM

## 2024-03-16 MED ORDER — IRON SUCROSE 20 MG/ML IV SOLN
200.0000 mg | Freq: Once | INTRAVENOUS | Status: AC
  Administered 2024-03-16: 200 mg via INTRAVENOUS
  Filled 2024-03-16: qty 10

## 2024-03-16 NOTE — Progress Notes (Deleted)
 Diagnosis: {Diagnosis:25401}  Provider:  {RYPWQFI:72011::Emjczzw Mannam MD}  Procedure: {Injection/Infusion:25339}  {IV Type:25393::Peripheral}, {IV Location:25394}  {Medications:25395}, {Dose:25397}  {Infusion Start Time:25399}  {Infusion Stop Time:25400}  Post Infusion IV Care: {[facility] Post Infusion:25398}  Discharge: {Condition:19696:::1}, {Destination:18313::Home:1} . {CHINFAVS:28985}  Performed by:  Armondo Cech, RN

## 2024-03-16 NOTE — Progress Notes (Signed)
 Diagnosis: Iron Deficiency Anemia  Provider:  Chilton Greathouse MD  Procedure: IV Push  IV Type: Peripheral, IV Location: L Antecubital  Venofer (Iron Sucrose), Dose: 200 mg  Post Infusion IV Care: Patient declined observation and Peripheral IV Discontinued  Discharge: Condition: Good, Destination: Home . AVS Declined  Performed by:  Garnette Czech, RN

## 2024-03-18 ENCOUNTER — Ambulatory Visit: Admitting: *Deleted

## 2024-03-18 VITALS — BP 120/73 | HR 53 | Temp 97.9°F | Resp 16 | Ht 63.0 in | Wt 134.6 lb

## 2024-03-18 DIAGNOSIS — D509 Iron deficiency anemia, unspecified: Secondary | ICD-10-CM | POA: Diagnosis not present

## 2024-03-18 MED ORDER — IRON SUCROSE 20 MG/ML IV SOLN
200.0000 mg | Freq: Once | INTRAVENOUS | Status: AC
  Administered 2024-03-18: 200 mg via INTRAVENOUS
  Filled 2024-03-18: qty 10

## 2024-03-18 NOTE — Progress Notes (Signed)
 Diagnosis: Iron  Deficiency Anemia  Provider:  Mannam, Praveen MD  Procedure: IV Push  IV Type: Peripheral, IV Location: L Antecubital  Venofer  (Iron  Sucrose), Dose: 200 mg  Post Infusion IV Care: Observation period completed and Peripheral IV Discontinued  Discharge: Condition: Good, Destination: Home . AVS Declined  Performed by:  Ilian Wessell E, RN

## 2024-03-18 NOTE — Progress Notes (Deleted)
 Diagnosis: {Diagnosis:25401}  Provider:  {ZHYQMVH:[id number]::"EXBMWUX Mannam MD"}  Procedure: IV Push  {IV Type:25393::"Peripheral"}, {IV Location:25394}  {Medications:25395}, {Dose:25397}  Post Infusion IV Care: {CHINF Post Infusion:25398}  Discharge: {Condition:19696:::1}, {Destination:18313::"Home":1} . {CHINFAVS:28985}  Performed by:  Devonne Folk, RN

## 2024-03-25 ENCOUNTER — Other Ambulatory Visit: Payer: Self-pay | Admitting: Family Medicine

## 2024-03-25 ENCOUNTER — Ambulatory Visit

## 2024-03-25 ENCOUNTER — Ambulatory Visit
Admission: RE | Admit: 2024-03-25 | Discharge: 2024-03-25 | Disposition: A | Discharge status: Home / Self Care | Source: Ambulatory Visit | Attending: Family Medicine | Admitting: Family Medicine

## 2024-03-25 DIAGNOSIS — N6489 Other specified disorders of breast: Secondary | ICD-10-CM

## 2024-03-25 DIAGNOSIS — R928 Other abnormal and inconclusive findings on diagnostic imaging of breast: Secondary | ICD-10-CM

## 2024-03-25 DIAGNOSIS — N6320 Unspecified lump in the left breast, unspecified quadrant: Secondary | ICD-10-CM

## 2024-03-25 DIAGNOSIS — N6002 Solitary cyst of left breast: Secondary | ICD-10-CM | POA: Diagnosis not present

## 2024-04-12 ENCOUNTER — Encounter: Payer: Self-pay | Admitting: Pulmonary Disease

## 2024-04-12 ENCOUNTER — Ambulatory Visit
Admission: RE | Admit: 2024-04-12 | Discharge: 2024-04-12 | Disposition: A | Discharge status: Home / Self Care | Source: Ambulatory Visit | Attending: Family Medicine | Admitting: Family Medicine

## 2024-04-12 ENCOUNTER — Ambulatory Visit
Admission: RE | Admit: 2024-04-12 | Discharge: 2024-04-12 | Disposition: A | Source: Ambulatory Visit | Attending: Family Medicine

## 2024-04-12 DIAGNOSIS — N6489 Other specified disorders of breast: Secondary | ICD-10-CM

## 2024-04-12 DIAGNOSIS — R928 Other abnormal and inconclusive findings on diagnostic imaging of breast: Secondary | ICD-10-CM

## 2024-04-12 DIAGNOSIS — N6320 Unspecified lump in the left breast, unspecified quadrant: Secondary | ICD-10-CM

## 2024-04-12 DIAGNOSIS — N6325 Unspecified lump in the left breast, overlapping quadrants: Secondary | ICD-10-CM | POA: Diagnosis not present

## 2024-04-12 DIAGNOSIS — N6002 Solitary cyst of left breast: Secondary | ICD-10-CM | POA: Diagnosis not present

## 2024-04-12 HISTORY — PX: BREAST BIOPSY: SHX20

## 2024-04-13 LAB — SURGICAL PATHOLOGY

## 2024-04-17 ENCOUNTER — Ambulatory Visit: Payer: Self-pay

## 2024-05-13 ENCOUNTER — Ambulatory Visit (HOSPITAL_COMMUNITY): Payer: Self-pay | Admitting: Physician Assistant

## 2024-05-13 ENCOUNTER — Other Ambulatory Visit (HOSPITAL_COMMUNITY): Payer: Self-pay

## 2024-05-13 ENCOUNTER — Encounter: Payer: Self-pay | Admitting: Pulmonary Disease

## 2024-05-13 ENCOUNTER — Ambulatory Visit (HOSPITAL_COMMUNITY)
Admission: EM | Admit: 2024-05-13 | Discharge: 2024-05-13 | Disposition: A | Discharge status: Home / Self Care | Attending: Physician Assistant | Admitting: Physician Assistant

## 2024-05-13 ENCOUNTER — Ambulatory Visit (INDEPENDENT_AMBULATORY_CARE_PROVIDER_SITE_OTHER)

## 2024-05-13 ENCOUNTER — Encounter (HOSPITAL_COMMUNITY): Payer: Self-pay

## 2024-05-13 DIAGNOSIS — M5412 Radiculopathy, cervical region: Secondary | ICD-10-CM

## 2024-05-13 DIAGNOSIS — R200 Anesthesia of skin: Secondary | ICD-10-CM | POA: Diagnosis not present

## 2024-05-13 DIAGNOSIS — M545 Low back pain, unspecified: Secondary | ICD-10-CM | POA: Diagnosis not present

## 2024-05-13 DIAGNOSIS — M4722 Other spondylosis with radiculopathy, cervical region: Secondary | ICD-10-CM | POA: Diagnosis not present

## 2024-05-13 DIAGNOSIS — R202 Paresthesia of skin: Secondary | ICD-10-CM

## 2024-05-13 MED ORDER — METHOCARBAMOL 500 MG PO TABS
500.0000 mg | ORAL_TABLET | Freq: Two times a day (BID) | ORAL | 0 refills | Status: AC
  Filled 2024-05-13: qty 20, 10d supply, fill #0

## 2024-05-13 MED ORDER — NAPROXEN 375 MG PO TABS
375.0000 mg | ORAL_TABLET | Freq: Two times a day (BID) | ORAL | 0 refills | Status: AC
  Filled 2024-05-13: qty 20, 10d supply, fill #0

## 2024-05-13 NOTE — ED Triage Notes (Signed)
 Pt c/o lower back pain x2 days. States has had numbness to lt hand off and on for past month. States took tylenol last night with no relief.

## 2024-05-13 NOTE — Discharge Instructions (Signed)
 I saw some degenerative changes (arthritis) on your x-ray but nothing that was broken or out of place.  I am waiting for the radiologist to read over me and if they see something else I will call you.  Start Naprosyn  twice daily.  Do not take NSAIDs with this medication including aspirin, ibuprofen /Advil , naproxen /Aleve .  You can use Tylenol/acetaminophen as needed for additional pain relief.  Take Robaxin  up to twice a day.  This will make you sleepy so do not drive or drink alcohol with taking it.  Use the brace on your left hand as much as possible for the next week and then use it at night for an additional week.  Follow-up with orthopedics; call to schedule an appointment.  If anything worsens and you have increasing numbness or tingling, worsening pain, fever, headache, vision change, nausea/vomiting you need to be seen immediately.

## 2024-05-13 NOTE — ED Provider Notes (Signed)
 [facility]    CSN: [id number] Arrival date & time: 05/13/24  9096      History   Chief Complaint Chief Complaint  Patient presents with   Back Pain    HPI Audrey Hinton is a 53 y.o. female.   Patient presents today with a prolonged history (several months) of intermittent left hand numbness that has become more persistent and bothersome over the past 2 days.  She reports initially this involves her 4th and 5th finger primarily but now involves her entire left hand.  She does not have any neck pain but is having some lower back pain.  She reports that this pain is rated 6 on a 0-10 pain scale.  She denies any bowel/bladder incontinence, lower extremity weakness, saddle anesthesia.  She denies personal history of malignancy; did have an abnormal mammogram but had biopsy several weeks ago that was benign.  She denies any history of diabetes.  She denies history of previous injury or surgery involving her back.  She is not experiencing any lower extremity numbness or tingling; reports that the symptoms are present only in her left hand.  She has tried Tylenol with temporary improvement of symptoms.  She has no concern for pregnancy.    Past Medical History:  Diagnosis Date   GERD (gastroesophageal reflux disease)     Patient Active Problem List   Diagnosis Date Noted   Iron  deficiency anemia 02/25/2024   Right-sided low back pain with right-sided sciatica 12/27/2014   Well adult exam 09/06/2014   GERD 09/07/2008   DIZZINESS 09/07/2008    Past Surgical History:  Procedure Laterality Date   BLADDER SURGERY     15 YRS AGO   BREAST BIOPSY Left 04/12/2024   US  LT BREAST BX W LOC DEV 1ST LESION IMG BX SPEC US  GUIDE 04/12/2024 [facility]    OB History     Gravida  5   Para  4   Term      Preterm      AB  1   Living  4      SAB  1   IAB      Ectopic      Multiple      Live Births               Home Medications    Prior to  Admission medications   Medication Sig Start Date End Date Taking? Authorizing Provider  methocarbamol  (ROBAXIN ) 500 MG tablet Take 1 tablet (500 mg total) by mouth 2 (two) times daily. 05/13/24  Yes Pailynn Vahey K, PA-C  naproxen  (NAPROSYN ) 375 MG tablet Take 1 tablet (375 mg total) by mouth 2 (two) times daily. 05/13/24  Yes Press Casale, Rocky POUR, PA-C  sulfacetamide  (BLEPH -10) 10 % ophthalmic solution Place 2 drops into the right eye every 4 (four) hours. 11/25/21   Lynwood Lenis, PA-C    Family History Family History  Problem Relation Age of Onset   Hypertension Mother    Hypertension Father    Stroke Father     Social History Social History   Tobacco Use   Smoking status: Never   Smokeless tobacco: Never  Vaping Use   Vaping status: Never Used  Substance Use Topics   Alcohol use: No   Drug use: No     Allergies   Patient has no known allergies.   Review of Systems Review of Systems  Constitutional:  Positive for activity change. Negative for appetite change, fatigue and fever.  Gastrointestinal:  Negative for nausea and vomiting.  Musculoskeletal:  Positive for back pain. Negative for arthralgias and myalgias.  Neurological:  Positive for numbness (left hand). Negative for weakness and headaches.     Physical Exam Triage Vital Signs ED Triage Vitals  Encounter Vitals Group     BP 05/13/24 0931 126/78     Girls Systolic BP Percentile --      Girls Diastolic BP Percentile --      Boys Systolic BP Percentile --      Boys Diastolic BP Percentile --      Pulse Rate 05/13/24 0931 62     Resp 05/13/24 0931 18     Temp 05/13/24 0931 97.7 F (36.5 C)     Temp Source 05/13/24 0931 Oral     SpO2 05/13/24 0931 98 %     Weight --      Height --      Head Circumference --      Peak Flow --      Pain Score 05/13/24 0929 6     Pain Loc --      Pain Education --      Exclude from Growth Chart --    No data found.  Updated Vital Signs BP 126/78 (BP Location: Left Arm)    Pulse 62   Temp 97.7 F (36.5 C) (Oral)   Resp 18   LMP 12/24/2023   SpO2 98%   Visual Acuity Right Eye Distance:   Left Eye Distance:   Bilateral Distance:    Right Eye Near:   Left Eye Near:    Bilateral Near:     Physical Exam Vitals reviewed.  Constitutional:      General: She is awake. She is not in acute distress.    Appearance: Normal appearance. She is well-developed. She is not ill-appearing.     Comments: Very pleasant female appears stated age in no acute distress sitting comfortably in exam room  HENT:     Head: Normocephalic and atraumatic.  Cardiovascular:     Rate and Rhythm: Normal rate and regular rhythm.     Heart sounds: Normal heart sounds, S1 normal and S2 normal. No murmur heard. Pulmonary:     Effort: Pulmonary effort is normal.     Breath sounds: Normal breath sounds. No wheezing, rhonchi or rales.     Comments: Clear to auscultation bilaterally Musculoskeletal:     Left hand: No bony tenderness. Normal range of motion. Normal strength. There is no disruption of two-point discrimination.     Cervical back: No tenderness or bony tenderness. No pain with movement.     Thoracic back: No tenderness or bony tenderness.     Lumbar back: No tenderness or bony tenderness. Negative right straight leg raise test and negative left straight leg raise test.     Comments: Back: No pain with percussion of vertebrae.  No deformity or step-off noted.  No significant tenderness to palpation of paraspinal muscles.  Negative straight leg raise bilaterally.  Negative FABER bilaterally.  Left hand: Normal 2-point discrimination.  Hand neurovascularly intact.  Normal pincer and grip strength.  Negative Tinel and Phalen's.  Negative Finkelstein.  Psychiatric:        Behavior: Behavior is cooperative.      UC Treatments / Results  Labs (all labs ordered are listed, but only abnormal results are displayed) Labs Reviewed - No data to display  EKG   Radiology No  results found.  Procedures Procedures (including  critical care time)  Medications Ordered in UC Medications - No data to display  Initial Impression / Assessment and Plan / UC Course  I have reviewed the triage vital signs and the nursing notes.  Pertinent labs & imaging results that were available during my care of the patient were reviewed by me and considered in my medical decision making (see chart for details).     Patient is well-appearing, afebrile, nontoxic, nontachycardic.  Hand is neurovascularly intact.  X-ray of cervical spine was obtained that showed degenerative changes and straightening of lordosis concerning for muscle spasm without evidence of acute osseous abnormality based on my primary read.  At the time of discharge we were waiting for radiologist over read and will contact her if this differs and changes her treatment plan.  She was started on Robaxin  twice a day for up to 10 days and we discussed that this medication is sedating and so she should avoid driving or drinking alcohol while taking it.  She was also given Naprosyn  twice a day.  Discussed that she should not combine this with additional NSAIDs due to risk of GI bleeding.  Notification for dose adjustment based on metabolic panel from 07/03/2021 with creatinine of 0.80 and calculated creatinine clearance of 76 mL/ per minute.  Can use Tylenol/acetaminophen for breakthrough pain.  Recommended close follow-up with orthopedics provider and she was given the contact information for local provider with instruction to call to schedule an appointment soon as possible.  We discussed that if anything worsens or changes she needs to be seen immediately.  Strict return precautions given.  Final Clinical Impressions(s) / UC Diagnoses   Final diagnoses:  Cervical radiculopathy  Numbness and tingling in left hand  Acute midline low back pain without sciatica     Discharge Instructions      I saw some degenerative changes  (arthritis) on your x-ray but nothing that was broken or out of place.  I am waiting for the radiologist to read over me and if they see something else I will call you.  Start Naprosyn  twice daily.  Do not take NSAIDs with this medication including aspirin, ibuprofen /Advil , naproxen /Aleve .  You can use Tylenol/acetaminophen as needed for additional pain relief.  Take Robaxin  up to twice a day.  This will make you sleepy so do not drive or drink alcohol with taking it.  Use the brace on your left hand as much as possible for the next week and then use it at night for an additional week.  Follow-up with orthopedics; call to schedule an appointment.  If anything worsens and you have increasing numbness or tingling, worsening pain, fever, headache, vision change, nausea/vomiting you need to be seen immediately.     ED Prescriptions     Medication Sig Dispense Auth. Provider   naproxen  (NAPROSYN ) 375 MG tablet Take 1 tablet (375 mg total) by mouth 2 (two) times daily. 20 tablet Thatcher Doberstein K, PA-C   methocarbamol  (ROBAXIN ) 500 MG tablet Take 1 tablet (500 mg total) by mouth 2 (two) times daily. 20 tablet Tamara Monteith K, PA-C      PDMP not reviewed this encounter.   Sherrell Rocky POUR, PA-C 05/13/24 1122

## 2024-06-06 NOTE — Addendum Note (Signed)
 Addended by: DAYNE SHERRY RAMAN on: 06/06/2024 09:08 AM   Modules accepted: Orders
